# Patient Record
Sex: Female | Born: 1945 | Hispanic: No | Marital: Married | Smoking: Never smoker
Health system: Southern US, Community
[De-identification: ages and names within clinical notes are randomized; demographics above are authoritative.]

## PROBLEM LIST (undated history)

## (undated) DIAGNOSIS — E7800 Pure hypercholesterolemia, unspecified: Secondary | ICD-10-CM

## (undated) DIAGNOSIS — Z8669 Personal history of other diseases of the nervous system and sense organs: Secondary | ICD-10-CM

## (undated) HISTORY — PX: COLONOSCOPY: SHX174

## (undated) HISTORY — DX: Personal history of other diseases of the nervous system and sense organs: Z86.69

---

## 2011-04-05 DIAGNOSIS — J329 Chronic sinusitis, unspecified: Secondary | ICD-10-CM | POA: Insufficient documentation

## 2011-04-24 DIAGNOSIS — E7800 Pure hypercholesterolemia, unspecified: Secondary | ICD-10-CM | POA: Insufficient documentation

## 2014-08-31 ENCOUNTER — Ambulatory Visit: Payer: Self-pay | Admitting: Gastroenterology

## 2015-02-25 ENCOUNTER — Encounter: Payer: Self-pay | Admitting: Urgent Care

## 2015-02-25 ENCOUNTER — Emergency Department
Admission: EM | Admit: 2015-02-25 | Discharge: 2015-02-25 | Disposition: A | Discharge status: Home / Self Care | Payer: Medicare HMO | Attending: Emergency Medicine | Admitting: Emergency Medicine

## 2015-02-25 ENCOUNTER — Emergency Department: Payer: Medicare HMO

## 2015-02-25 DIAGNOSIS — Y998 Other external cause status: Secondary | ICD-10-CM | POA: Diagnosis not present

## 2015-02-25 DIAGNOSIS — Y9389 Activity, other specified: Secondary | ICD-10-CM | POA: Insufficient documentation

## 2015-02-25 DIAGNOSIS — T148 Other injury of unspecified body region: Secondary | ICD-10-CM | POA: Insufficient documentation

## 2015-02-25 DIAGNOSIS — M791 Myalgia, unspecified site: Secondary | ICD-10-CM

## 2015-02-25 DIAGNOSIS — Y92410 Unspecified street and highway as the place of occurrence of the external cause: Secondary | ICD-10-CM | POA: Insufficient documentation

## 2015-02-25 MED ORDER — TRAMADOL HCL 50 MG PO TABS: 50.0000 mg | ORAL_TABLET | Freq: Four times a day (QID) | ORAL | Status: DC | PRN

## 2015-02-25 MED ORDER — CYCLOBENZAPRINE HCL 10 MG PO TABS
10.0000 mg | ORAL_TABLET | Freq: Once | ORAL | Status: AC
  Administered 2015-02-25: 10 mg via ORAL
  Filled 2015-02-25: qty 1

## 2015-02-25 MED ORDER — OXYCODONE-ACETAMINOPHEN 5-325 MG PO TABS
1.0000 | ORAL_TABLET | Freq: Once | ORAL | Status: AC
  Administered 2015-02-25: 1 via ORAL
  Filled 2015-02-25: qty 1

## 2015-02-25 MED ORDER — IBUPROFEN 800 MG PO TABS: 800.0000 mg | ORAL_TABLET | Freq: Three times a day (TID) | ORAL | Status: DC | PRN

## 2015-02-25 MED ORDER — IBUPROFEN 800 MG PO TABS
800.0000 mg | ORAL_TABLET | Freq: Once | ORAL | Status: AC
  Administered 2015-02-25: 800 mg via ORAL
  Filled 2015-02-25: qty 1

## 2015-02-25 MED ORDER — CYCLOBENZAPRINE HCL 10 MG PO TABS: 10.0000 mg | ORAL_TABLET | Freq: Three times a day (TID) | ORAL | Status: DC | PRN
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
## 2015-02-25 NOTE — ED Notes (Signed)
Patient back from  X-ray 
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
## 2015-02-25 NOTE — ED Notes (Signed)
Patient transported to X-ray 
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
## 2015-12-27 DIAGNOSIS — M8589 Other specified disorders of bone density and structure, multiple sites: Secondary | ICD-10-CM | POA: Insufficient documentation

## 2015-12-27 DIAGNOSIS — R748 Abnormal levels of other serum enzymes: Secondary | ICD-10-CM | POA: Insufficient documentation

## 2015-12-27 DIAGNOSIS — M1712 Unilateral primary osteoarthritis, left knee: Secondary | ICD-10-CM | POA: Insufficient documentation

## 2016-02-28 ENCOUNTER — Ambulatory Visit: Payer: Self-pay

## 2016-02-28 ENCOUNTER — Encounter: Payer: Self-pay | Admitting: Podiatry

## 2016-02-28 ENCOUNTER — Ambulatory Visit (INDEPENDENT_AMBULATORY_CARE_PROVIDER_SITE_OTHER): Payer: Medicare HMO

## 2016-02-28 ENCOUNTER — Ambulatory Visit (INDEPENDENT_AMBULATORY_CARE_PROVIDER_SITE_OTHER): Payer: Medicare HMO | Admitting: Podiatry

## 2016-02-28 DIAGNOSIS — M79672 Pain in left foot: Secondary | ICD-10-CM

## 2016-02-28 DIAGNOSIS — R52 Pain, unspecified: Secondary | ICD-10-CM

## 2016-02-28 DIAGNOSIS — M722 Plantar fascial fibromatosis: Secondary | ICD-10-CM | POA: Diagnosis not present

## 2016-02-28 MED ORDER — METHYLPREDNISOLONE 4 MG PO TBPK: ORAL_TABLET | ORAL | 0 refills | Status: DC

## 2016-02-28 MED ORDER — MELOXICAM 15 MG PO TABS: 15.0000 mg | ORAL_TABLET | Freq: Every day | ORAL | 3 refills | Status: DC

## 2016-02-28 NOTE — Progress Notes (Signed)
   Subjective:    Patient ID: Valerie Berry, female    DOB: 10/28/1945, 70 y.o.   MRN: [id number]  HPI: She presents today with a chief complaint of painful heels and arches 2 years left greater than right. Since that morning pain is much worse than the left. Has tried new shoes which has helped to some degree.  Chief Complaint  Patient presents with  . Foot Pain    B/L HEEL/TOP OF THE FOOT HAVING SHARP PAIN FOR 3 MONTHS. FEET ARE WORSE WHEN WALKING. TRIED NO TREATMENT.     Review of Systems  Musculoskeletal: Positive for gait problem.       Objective:   Physical Exam: Vital signs are stable she is alert and oriented 3 and pleasant [place] female. Pulses are strongly palpable neurologic sensorium is intact. Deep tendon reflexes are intact. Muscle strength is 5 over 5 dorsiflexion plantar flexors and inverters everters all intrinsic musculature is intact. Orthopedic evaluation demonstrates all joints distal to the ankle for range of motion without crepitation. Cutaneous evaluation demonstrates supple well-hydrated cutis no erythema or edema cellulitis drainage or odor. Pain on palpation medial calcaneal tubercles bilateral. Radiographs demonstrate soft tissue increase in density at the plantar fascia calcaneal insertion sites bilateral. No other major osseous abnormalities are identified.          Assessment & Plan:  Assessment: Plantar fasciitis bilateral long-standing.  Plan: Injected the left and right heels today with Kenalog and local anesthetic. Placed her in plantar fascia brace and a night splint. Placed her on methylprednisolone to be followed by meloxicam. We discussed appropriate shoe gear shift exercises ice therapy issue modifications. We discussed the etiology pathology conservative versus surgical therapies. I'll follow up with her in 1 month.

## 2016-02-28 NOTE — Patient Instructions (Signed)

## 2016-03-14 ENCOUNTER — Telehealth: Payer: Self-pay | Admitting: Podiatry

## 2016-03-14 DIAGNOSIS — R7303 Prediabetes: Secondary | ICD-10-CM | POA: Insufficient documentation

## 2016-03-14 DIAGNOSIS — R079 Chest pain, unspecified: Secondary | ICD-10-CM | POA: Insufficient documentation

## 2016-03-14 MED ORDER — NONFORMULARY OR COMPOUNDED ITEM
6 refills | Status: DC
Start: 2016-03-14 — End: 2019-01-15

## 2016-03-14 NOTE — Addendum Note (Signed)
Addended by: Alphia Kava'CONNELL, VALERY D on: 03/14/2016 03:19 PM   Modules accepted: Orders

## 2016-03-14 NOTE — Telephone Encounter (Addendum)
Pt states the Meloxicam is making her nervous, has taken 11 days. Pt states she can't take the Ibuprofen because it bother her stomach. Dr. Al CorpusHyatt ordered [facility] Antiinflammatory cream.  Informed pt and she states understanding. [facility] to [facility].

## 2016-03-14 NOTE — Telephone Encounter (Signed)
Yes sure 

## 2016-03-14 NOTE — Telephone Encounter (Signed)
Patient called, said she cannot take the RX for pain that Dr. Al CorpusHyatt gave her its making her anxious and nervous. Wants nurse to call her about getting another script.

## 2016-04-03 ENCOUNTER — Encounter: Payer: Self-pay | Admitting: Podiatry

## 2016-04-03 ENCOUNTER — Ambulatory Visit (INDEPENDENT_AMBULATORY_CARE_PROVIDER_SITE_OTHER): Payer: Medicare HMO | Admitting: Podiatry

## 2016-04-03 VITALS — BP 128/73 | HR 57 | Ht 63.0 in | Wt 134.0 lb

## 2016-04-03 DIAGNOSIS — M722 Plantar fascial fibromatosis: Secondary | ICD-10-CM | POA: Diagnosis not present

## 2016-04-03 NOTE — Progress Notes (Signed)
She presents today for follow-up of her plantar fasciitis. She states that she is doing some better but is still sore.  Objective: Vital signs are stable she's alert and oriented 3. Pulses are palpable. Pain on palpation medial calcaneal tubercles.  Assessment: Fasciitis.  Plan: We injected bilateral heels follow up with her in 1 month.

## 2016-04-05 ENCOUNTER — Emergency Department: Payer: Medicare HMO

## 2016-04-05 ENCOUNTER — Emergency Department
Admission: EM | Admit: 2016-04-05 | Discharge: 2016-04-05 | Disposition: A | Discharge status: Home / Self Care | Payer: Medicare HMO | Attending: Emergency Medicine | Admitting: Emergency Medicine

## 2016-04-05 ENCOUNTER — Encounter: Payer: Self-pay | Admitting: Emergency Medicine

## 2016-04-05 DIAGNOSIS — Z79899 Other long term (current) drug therapy: Secondary | ICD-10-CM | POA: Insufficient documentation

## 2016-04-05 DIAGNOSIS — Z791 Long term (current) use of non-steroidal anti-inflammatories (NSAID): Secondary | ICD-10-CM | POA: Diagnosis not present

## 2016-04-05 DIAGNOSIS — R1011 Right upper quadrant pain: Secondary | ICD-10-CM | POA: Insufficient documentation

## 2016-04-05 DIAGNOSIS — R109 Unspecified abdominal pain: Secondary | ICD-10-CM | POA: Diagnosis present

## 2016-04-05 HISTORY — DX: Pure hypercholesterolemia, unspecified: E78.00

## 2016-04-05 LAB — COMPREHENSIVE METABOLIC PANEL
ALBUMIN: 4.7 g/dL (ref 3.5–5.0)
ALK PHOS: 63 U/L (ref 38–126)
ALT: 34 U/L (ref 14–54)
AST: 29 U/L (ref 15–41)
Anion gap: 9 (ref 5–15)
BUN: 22 mg/dL — AB (ref 6–20)
CALCIUM: 9.4 mg/dL (ref 8.9–10.3)
CO2: 23 mmol/L (ref 22–32)
Chloride: 106 mmol/L (ref 101–111)
Creatinine, Ser: 0.62 mg/dL (ref 0.44–1.00)
GFR calc Af Amer: >60 mL/min (ref >60)
GFR calc non Af Amer: >60 mL/min (ref >60)
GLUCOSE: 101 mg/dL — AB (ref 65–99)
Potassium: 3.5 mmol/L (ref 3.5–5.1)
Sodium: 138 mmol/L (ref 135–145)
TOTAL PROTEIN: 7.8 g/dL (ref 6.5–8.1)
Total Bilirubin: 0.7 mg/dL (ref 0.3–1.2)

## 2016-04-05 LAB — URINALYSIS COMPLETE WITH MICROSCOPIC (ARMC ONLY)
BILIRUBIN URINE: NEGATIVE
Bacteria, UA: NONE SEEN
GLUCOSE, UA: NEGATIVE mg/dL
Ketones, ur: NEGATIVE mg/dL
Leukocytes, UA: NEGATIVE
NITRITE: NEGATIVE
Protein, ur: NEGATIVE mg/dL
SPECIFIC GRAVITY, URINE: 1.005 (ref 1.005–1.030)
pH: 6 (ref 5.0–8.0)

## 2016-04-05 LAB — CBC
HCT: 41.4 % (ref 35.0–47.0)
Hemoglobin: 14.2 g/dL (ref 12.0–16.0)
MCH: 30.3 pg (ref 26.0–34.0)
MCHC: 34.2 g/dL (ref 32.0–36.0)
MCV: 88.5 fL (ref 80.0–100.0)
Platelets: 233 10*3/uL (ref 150–440)
RBC: 4.68 MIL/uL (ref 3.80–5.20)
RDW: 13.1 % (ref 11.5–14.5)
WBC: 7.8 10*3/uL (ref 3.6–11.0)

## 2016-04-05 LAB — LIPASE, BLOOD: Lipase: 29 U/L (ref 11–51)

## 2016-04-05 MED ORDER — CARISOPRODOL 350 MG PO TABS: 350.0000 mg | ORAL_TABLET | Freq: Three times a day (TID) | ORAL | 0 refills | Status: AC | PRN

## 2016-04-05 MED ORDER — IOPAMIDOL (ISOVUE-300) INJECTION 61%
100.0000 mL | Freq: Once | INTRAVENOUS | Status: AC | PRN
  Administered 2016-04-05: 100 mL via INTRAVENOUS
  Filled 2016-04-05: qty 100

## 2016-04-05 MED ORDER — IOPAMIDOL (ISOVUE-300) INJECTION 61%
30.0000 mL | Freq: Once | INTRAVENOUS | Status: AC
  Administered 2016-04-05: 30 mL via ORAL
  Filled 2016-04-05: qty 30

## 2016-04-05 MED ORDER — SODIUM CHLORIDE 0.9 % IV BOLUS (SEPSIS)
1000.0000 mL | Freq: Once | INTRAVENOUS | Status: AC
  Administered 2016-04-05: 1000 mL via INTRAVENOUS

## 2016-04-05 NOTE — ED Notes (Signed)
Pt states she is done with both bottles of contrast. CT notified.

## 2016-04-05 NOTE — ED Notes (Signed)
Pt returned from CT via stretcher.

## 2016-04-05 NOTE — ED Provider Notes (Signed)
[facility] Emergency Department Provider Note   ____________________________________________   First MD Initiated Contact with Patient 04/05/16 1710     (approximate)  I have reviewed the triage vital signs and the nursing notes.   HISTORY  Chief Complaint Abdominal Pain   HPI Valerie Berry is a 70 y.o. female with a history of high cholesterol who is presenting with 3 weeks of worsening right-sided abdominal pain as well as right-sided flank pain. Says the pain is constant and slowly worsening. She denies any nausea vomiting or diarrhea. Denies any injury. Denies any worsening with eating or bending or twisting.   Past Medical History:  Diagnosis Date  . High cholesterol     Patient Active Problem List   Diagnosis Date Noted  . Plantar fasciitis, bilateral 02/28/2016    Past Surgical History:  Procedure Laterality Date  . CESAREAN SECTION      Prior to Admission medications   Medication Sig Start Date End Date Taking? Authorizing Provider  atorvastatin (LIPITOR) 20 MG tablet Take by mouth. 01/06/16 07/04/16  Historical Provider, MD  cyclobenzaprine (FLEXERIL) 10 MG tablet Take 1 tablet (10 mg total) by mouth every 8 (eight) hours as needed for muscle spasms. 02/25/15   Joni Reining, PA-C  ibuprofen (ADVIL,MOTRIN) 800 MG tablet Take 1 tablet (800 mg total) by mouth every 8 (eight) hours as needed for moderate pain. 02/25/15   Joni Reining, PA-C  meloxicam (MOBIC) 15 MG tablet Take 1 tablet (15 mg total) by mouth daily. 02/28/16   Max T Hyatt, DPM  methylPREDNISolone (MEDROL) 4 MG TBPK tablet Tapering 6 day dose pack 02/28/16   Max T [place], DPM  NONFORMULARY OR COMPOUNDED ITEM [facility]:  Antiinflammatory cream - Diclofenac 3%, Baclofen 2%, Cyclobenzaprine 2%, Lidocaine 2%, apply 1-2 grams to affected area 3-4 times daily. 03/14/16   Max T Hyatt, DPM  traMADol (ULTRAM) 50 MG tablet Take 1 tablet (50 mg total) by mouth every 6 (six) hours as  needed for moderate pain. 02/25/15   Joni Reining, PA-C    Allergies Review of patient's allergies indicates no known allergies.  No family history on file.  Social History Social History  Substance Use Topics  . Smoking status: Never Smoker  . Smokeless tobacco: Never Used  . Alcohol use No    Review of Systems Constitutional: No fever/chills Eyes: No visual changes. ENT: No sore throat. Cardiovascular: Denies chest pain. Respiratory: Denies shortness of breath. Gastrointestinal:No nausea, no vomiting.  No diarrhea.  No constipation. Genitourinary: Negative for dysuria. Musculoskeletal: Negative for back pain. Skin: Negative for rash. Neurological: Negative for headaches, focal weakness or numbness.  10-point ROS otherwise negative.  ____________________________________________   PHYSICAL EXAM:  VITAL SIGNS: ED Triage Vitals  Enc Vitals Group     BP 04/05/16 1534 (!) 153/83     Pulse Rate 04/05/16 1534 (!) 58     Resp 04/05/16 1534 16     Temp 04/05/16 1534 97.9 F (36.6 C)     Temp Source 04/05/16 1534 Oral     SpO2 04/05/16 1534 98 %     Weight 04/05/16 1533 138 lb (62.6 kg)     Height 04/05/16 1533 5\' 3"  (1.6 m)     Head Circumference --      Peak Flow --      Pain Score 04/05/16 1533 10     Pain Loc --      Pain Edu? --      Excl.  in GC? --     Constitutional: Alert and oriented. Well appearing and in no acute distress. Eyes: Conjunctivae are normal. PERRL. EOMI. Head: Atraumatic. Nose: No congestion/rhinnorhea. Mouth/Throat: Mucous membranes are moist.   Neck: No stridor.   Cardiovascular: Normal rate, regular rhythm. Grossly normal heart sounds.   Respiratory: Normal respiratory effort.  No retractions. Lungs CTAB. Gastrointestinal: Soft with right-sided abdominal pain with a negative Murphy sign. No distention. No abdominal bruits. Positive for high right-sided CVA tenderness. Musculoskeletal: No lower extremity tenderness nor edema.  No joint  effusions. Neurologic:  Normal speech and language. No gross focal neurologic deficits are appreciated. No gait instability. Skin:  Skin is warm, dry and intact. No rash noted. Psychiatric: Mood and affect are normal. Speech and behavior are normal.  ____________________________________________   LABS (all labs ordered are listed, but only abnormal results are displayed)  Labs Reviewed  COMPREHENSIVE METABOLIC PANEL - Abnormal; Notable for the following:       Result Value   Glucose, Bld 101 (*)    BUN 22 (*)    All other components within normal limits  URINALYSIS COMPLETEWITH MICROSCOPIC ([facility] ONLY) - Abnormal; Notable for the following:    Color, Urine COLORLESS (*)    APPearance CLEAR (*)    Hgb urine dipstick 1+ (*)    Squamous Epithelial / LPF 0-5 (*)    All other components within normal limits  LIPASE, BLOOD  CBC   ____________________________________________  EKG  ED ECG REPORT I, Arelia LongestSchaevitz,  Janelie Goltz M, the attending physician, personally viewed and interpreted this ECG.   Date: 04/05/2016  EKG Time: 1540  Rate: 64  Rhythm: normal sinus rhythm with sinus arrhythmia  Axis: Normal  Intervals:none  ST&T Change: No ST segment elevation or depression. No abnormal T-wave inversion.  ____________________________________________  RADIOLOGY  CT Abdomen Pelvis W Contrast (Accession [id number]) (Order [id number])  Imaging  Date: 04/05/2016 Department: [facility] EMERGENCY DEPARTMENT Released By/Authorizing: Myrna Blazeravid Matthew Alora Gorey, MD (auto-released)  PACS Images   Show images for CT Abdomen Pelvis W Contrast  Study Result   CLINICAL DATA:  Right-sided abdominal pain. Abdominal pressure for 1 week, worse today. Nausea.  EXAM: CT ABDOMEN AND PELVIS WITH CONTRAST  TECHNIQUE: Multidetector CT imaging of the abdomen and pelvis was performed using the standard protocol following bolus administration of intravenous contrast.  CONTRAST:   100mL ISOVUE-300 IOPAMIDOL (ISOVUE-300) INJECTION 61% IV  COMPARISON:  None.  FINDINGS: Lower chest: Motion obscured. Tiny subpleural density in the left lower lobe is likely atelectasis.  Hepatobiliary: No focal liver abnormality is seen. No gallstones, gallbladder wall thickening, or biliary dilatation. Mild motion through the hepatic dome.  Pancreas: No ductal dilatation or inflammation.  Spleen: Normal in size without focal abnormality.  Adrenals/Urinary Tract: Adrenal glands are unremarkable. Kidneys are normal, without renal calculi, focal lesion, or hydronephrosis. Symmetric renal excretion. Bladder is unremarkable.  Stomach/Bowel: Stomach is within normal limits. No evidence of bowel wall thickening, distention, or inflammatory changes. Moderate diffuse stool burden with tortuosity of the sigmoid colon. No bowel obstruction. No abnormal rectal distention. Appendix appears normal.  Vascular/Lymphatic: No significant vascular findings are present. No enlarged abdominal or pelvic lymph nodes.  Reproductive: Uterus and bilateral adnexa are unremarkable.  Other: No free air, free fluid, or intra-abdominal fluid collection.  Musculoskeletal: No acute or significant osseous findings. 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  IMPRESSION: 1. No acute abnormality. 2. Moderate stool burden with colonic tortuosity, can be seen with constipation.  Electronically Signed   By: Rubye Oaks M.D.   On: 04/05/2016 21:04    ____________________________________________   PROCEDURES  Procedure(s) performed:   Procedures  Critical Care performed:   ____________________________________________   INITIAL IMPRESSION / ASSESSMENT AND PLAN / ED COURSE  Pertinent labs & imaging results that were available during my care of the patient were reviewed by me and considered in my medical decision making (see chart for  details).  ----------------------------------------- 9:54 PM on 04/05/2016 -----------------------------------------  Patient very reassuring workup.  This comfortable appearing in the room. Reexamined her abdomen and is very minimally tender to the right upper quadrant. I discussed the lab workup as well as the imaging study with possible constipation. However, she says that she moves her bowels regularly every morning including this morning. 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 She says that she has been using icy hot to the area which has been helping minimally. We will try a muscle relaxer in addition. She denies the pain worsening with deep breathing. No chest pain or shortness of breath. Normal EKG  Unlikely to be cardiac pathology or PE. Possible musculoskeletal etiology. We'll discharge with muscle relaxer.   Clinical Course     ____________________________________________   FINAL CLINICAL IMPRESSION(S) / ED DIAGNOSES  Right-sided abdominal pain. Right flank pain.    NEW MEDICATIONS STARTED DURING THIS VISIT:  New Prescriptions   No medications on file     Note:  This document was prepared using Dragon voice recognition software and may include unintentional dictation errors.    Myrna Blazer, MD 04/05/16 2204

## 2016-04-05 NOTE — ED Triage Notes (Signed)
Pt reports abdominal pressure x1 week, reports pain has worsened today. Pt reports nausea, denies vomiting or diarrhea.

## 2016-05-08 ENCOUNTER — Ambulatory Visit: Payer: Medicare HMO | Admitting: Podiatry

## 2017-02-05 DIAGNOSIS — M25562 Pain in left knee: Secondary | ICD-10-CM | POA: Insufficient documentation

## 2017-02-05 DIAGNOSIS — G8929 Other chronic pain: Secondary | ICD-10-CM | POA: Insufficient documentation

## 2017-02-05 DIAGNOSIS — K219 Gastro-esophageal reflux disease without esophagitis: Secondary | ICD-10-CM | POA: Insufficient documentation

## 2017-09-21 IMAGING — CT CT ABD-PELV W/ CM
2 of 5 series · 16 of 46 positions shown, 18 images · IV contrast (APPLIED)
Comparison: None.

CLINICAL DATA: Right-sided abdominal pain. Abdominal pressure for 1
week, worse today. Nausea.

EXAM:
CT ABDOMEN AND PELVIS WITH CONTRAST
TECHNIQUE: Multidetector CT imaging of the abdomen and pelvis was performed
using the standard protocol following bolus administration of
intravenous contrast.
CONTRAST:  100mL HZP2JG-866 IOPAMIDOL (HZP2JG-866) INJECTION 61% IV

[Series 2: axial st · axial · 0.67mm/px · z∈[-779,-379]mm · 13 of 90 slices shown, 15 images]
[im 5/90  soft-tissue]
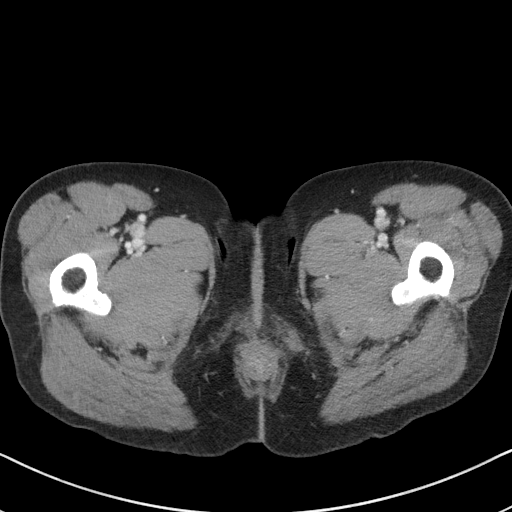
[im 5/90  bone]
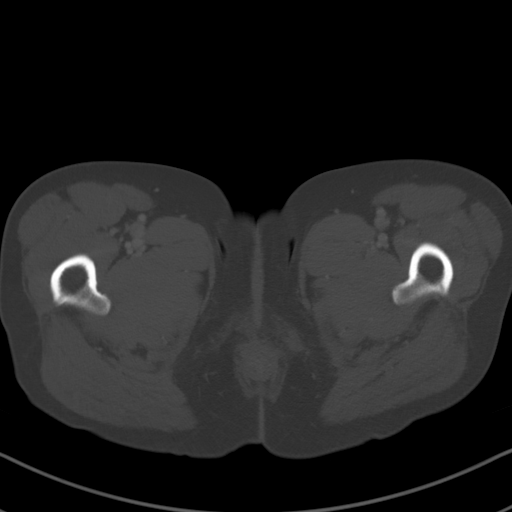
[im 14/90  soft-tissue]
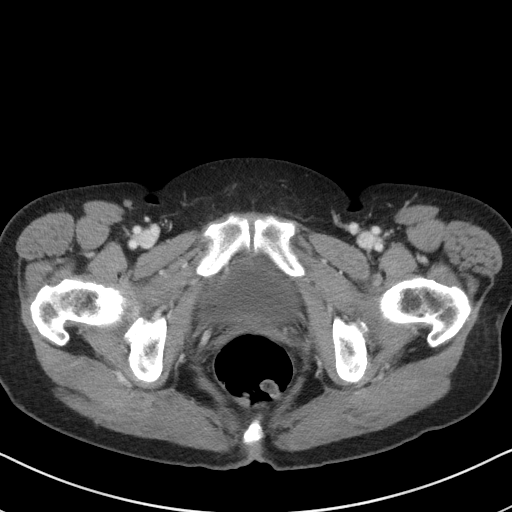
[im 18/90  soft-tissue]
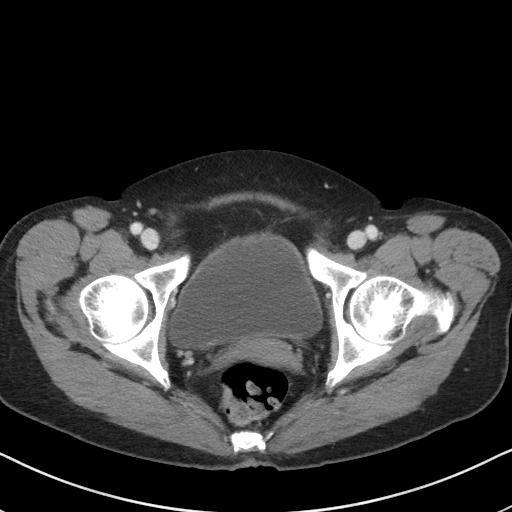
[im 27/90  soft-tissue]
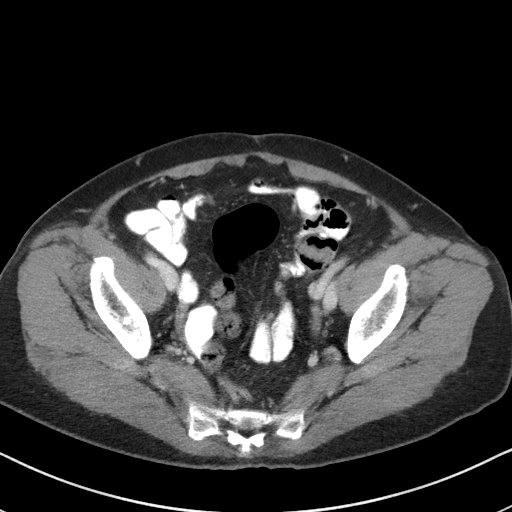
[im 32/90  soft-tissue]
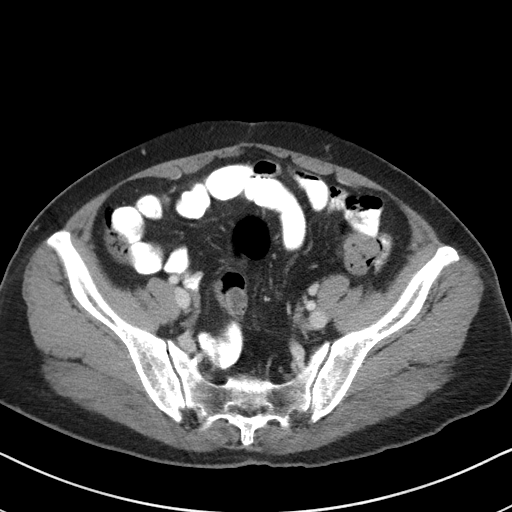
[im 41/90  soft-tissue]
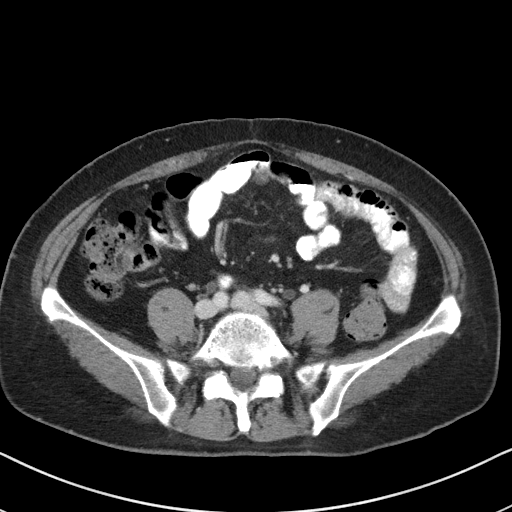
[im 45/90  soft-tissue]
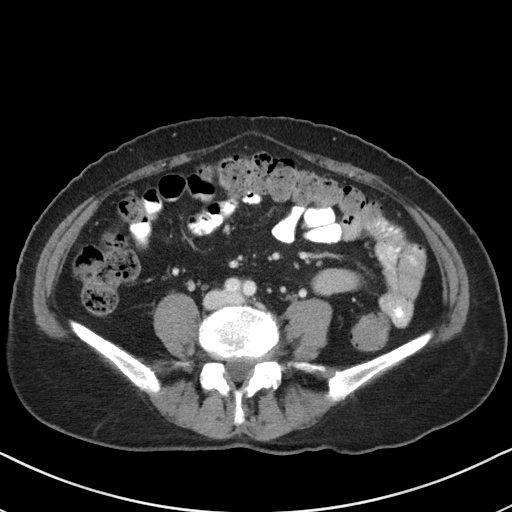
[im 49/90  soft-tissue]
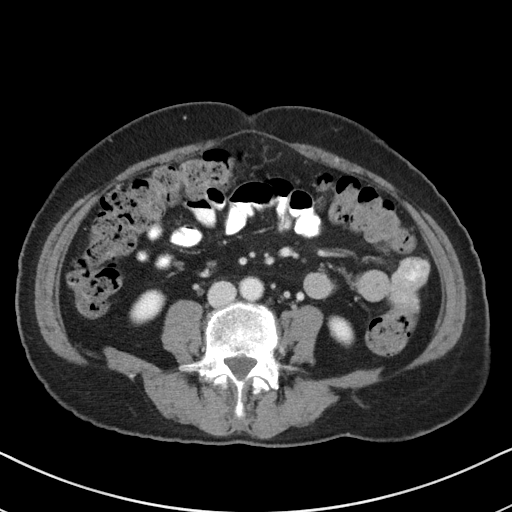
[im 58/90  soft-tissue]
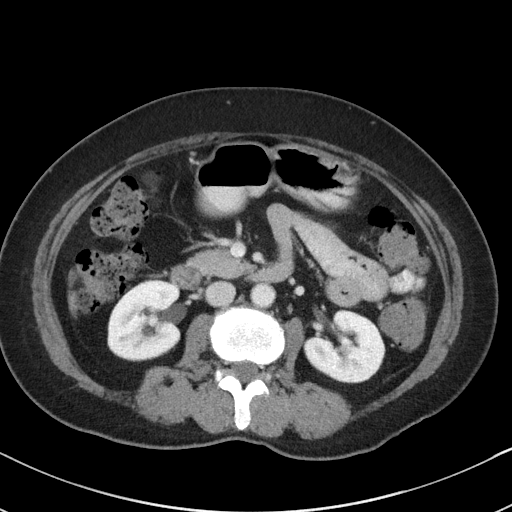
[im 58/90  bone]
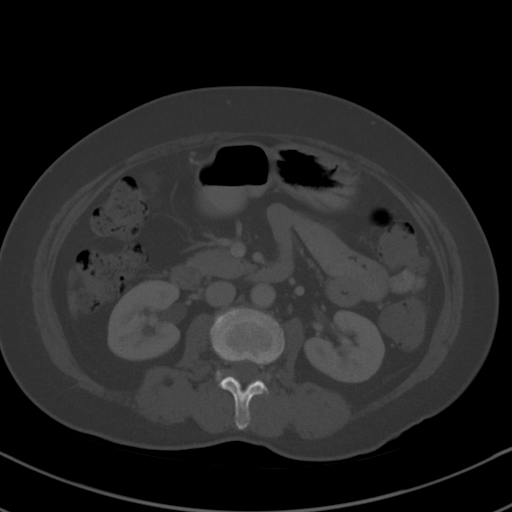
[im 63/90  soft-tissue]
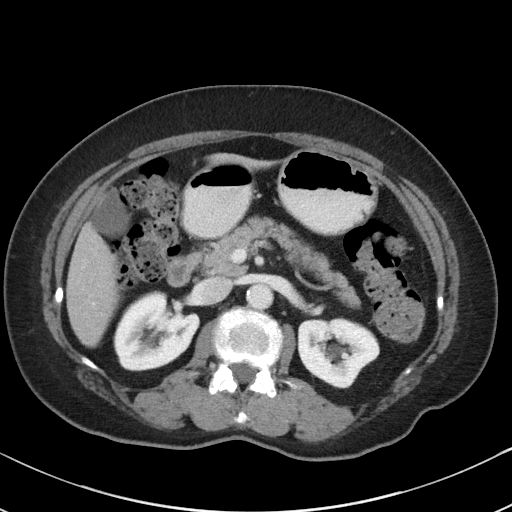
[im 72/90  soft-tissue]
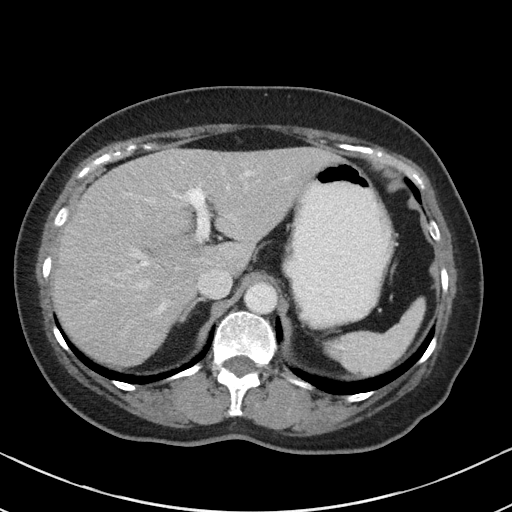
[im 76/90  soft-tissue]
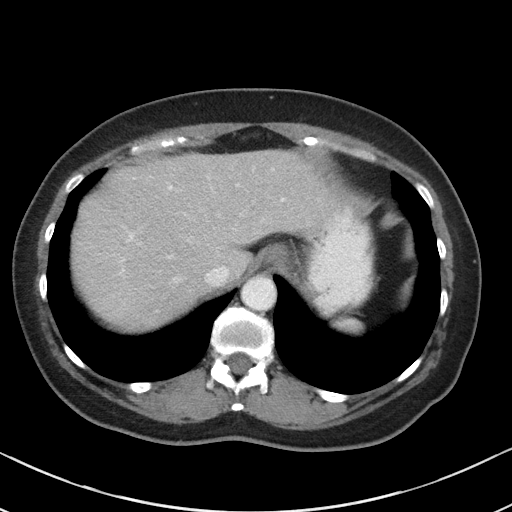
[im 85/90  soft-tissue]
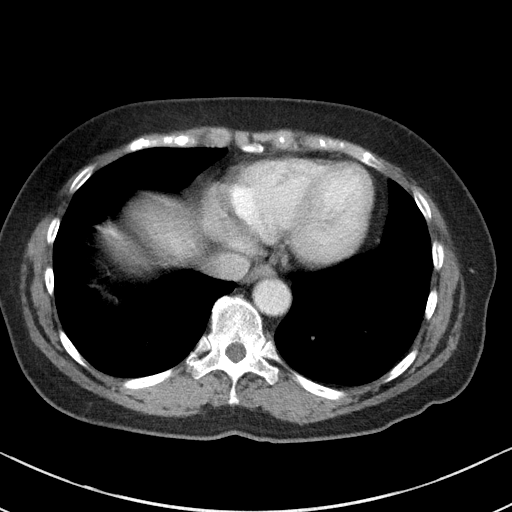

[Series 6: coronal st · coronal · 0.68mm/px · 3 of 87 slices shown]
[im 29/87  soft-tissue]
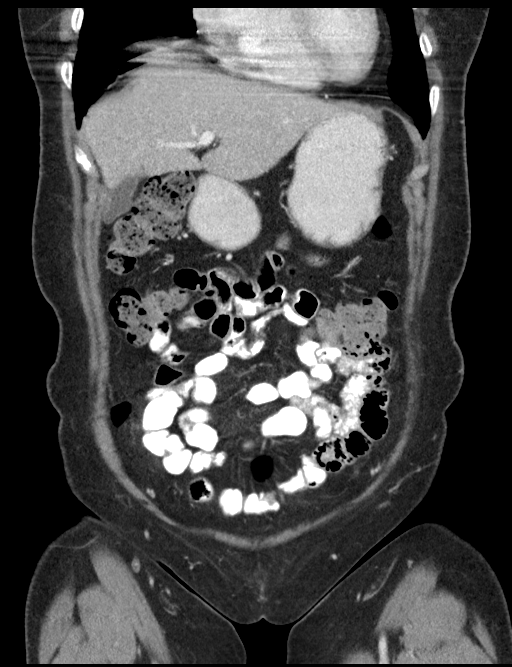
[im 39/87  soft-tissue]
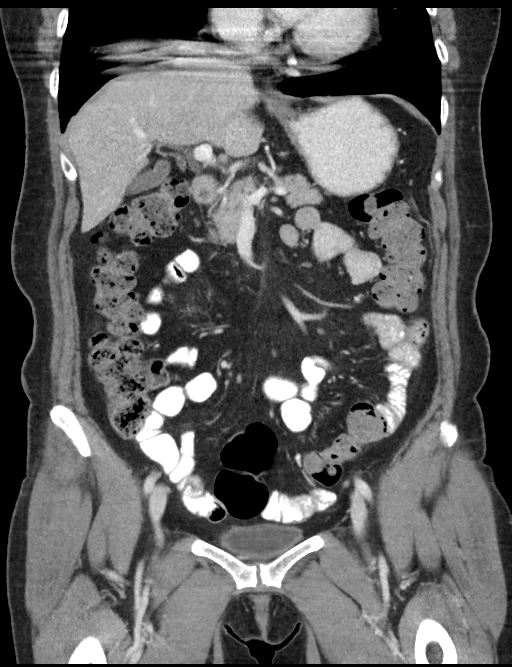
[im 48/87  soft-tissue]
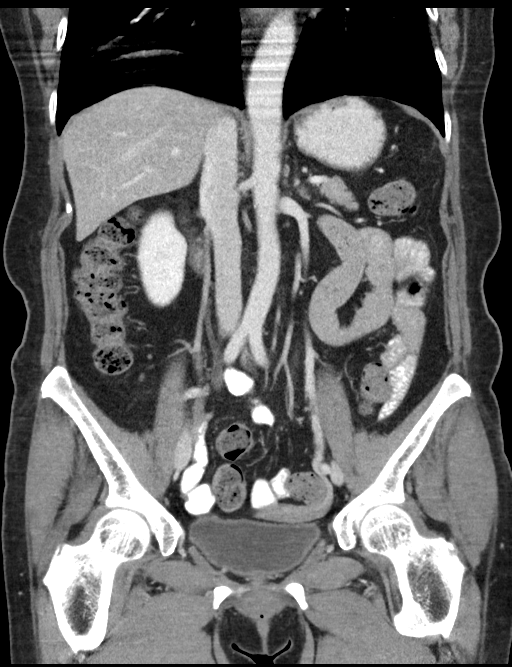

[16 of 46 positions shown; findings below may reference images not displayed]

FINDINGS: Lower chest: Motion obscured. Tiny subpleural density in the left
lower lobe is likely atelectasis.

Hepatobiliary: No focal liver abnormality is seen. No gallstones,
gallbladder wall thickening, or biliary dilatation. Mild motion
through the hepatic dome.

Pancreas: No ductal dilatation or inflammation.

Spleen: Normal in size without focal abnormality.

Adrenals/Urinary Tract: Adrenal glands are unremarkable. Kidneys are
normal, without renal calculi, focal lesion, or hydronephrosis.
Symmetric renal excretion. Bladder is unremarkable.

Stomach/Bowel: Stomach is within normal limits. No evidence of bowel
wall thickening, distention, or inflammatory changes. Moderate
diffuse stool burden with tortuosity of the sigmoid colon. No bowel
obstruction. No abnormal rectal distention. Appendix appears normal.

Vascular/Lymphatic: No significant vascular findings are present. No
enlarged abdominal or pelvic lymph nodes.

Reproductive: Uterus and bilateral adnexa are unremarkable.

Other: No free air, free fluid, or intra-abdominal fluid collection.

Musculoskeletal: No acute or significant osseous findings.
Degenerative disc disease at L4-L5 and L5-S1.
IMPRESSION: 1. No acute abnormality.
2. Moderate stool burden with colonic tortuosity, can be seen with
constipation.

## 2017-12-24 DIAGNOSIS — M19011 Primary osteoarthritis, right shoulder: Secondary | ICD-10-CM | POA: Insufficient documentation

## 2018-04-19 ENCOUNTER — Other Ambulatory Visit: Payer: Self-pay | Admitting: Internal Medicine

## 2018-04-19 DIAGNOSIS — Z1231 Encounter for screening mammogram for malignant neoplasm of breast: Secondary | ICD-10-CM

## 2018-04-19 DIAGNOSIS — R3129 Other microscopic hematuria: Secondary | ICD-10-CM | POA: Insufficient documentation

## 2018-04-19 DIAGNOSIS — R7401 Elevation of levels of liver transaminase levels: Secondary | ICD-10-CM | POA: Insufficient documentation

## 2018-05-13 ENCOUNTER — Ambulatory Visit
Admission: RE | Admit: 2018-05-13 | Discharge: 2018-05-13 | Disposition: A | Discharge status: Home / Self Care | Payer: Medicare HMO | Source: Ambulatory Visit | Attending: Internal Medicine | Admitting: Internal Medicine

## 2018-05-13 DIAGNOSIS — Z1231 Encounter for screening mammogram for malignant neoplasm of breast: Secondary | ICD-10-CM | POA: Diagnosis present

## 2019-01-15 ENCOUNTER — Other Ambulatory Visit: Payer: Self-pay

## 2019-01-15 ENCOUNTER — Encounter: Payer: Self-pay | Admitting: Podiatry

## 2019-01-15 ENCOUNTER — Ambulatory Visit (INDEPENDENT_AMBULATORY_CARE_PROVIDER_SITE_OTHER): Payer: Medicare HMO

## 2019-01-15 ENCOUNTER — Ambulatory Visit (INDEPENDENT_AMBULATORY_CARE_PROVIDER_SITE_OTHER): Payer: Medicare HMO | Admitting: Podiatry

## 2019-01-15 VITALS — Temp 97.6°F

## 2019-01-15 DIAGNOSIS — M722 Plantar fascial fibromatosis: Secondary | ICD-10-CM

## 2019-01-15 MED ORDER — DICLOFENAC SODIUM 75 MG PO TBEC: 75.0000 mg | DELAYED_RELEASE_TABLET | Freq: Two times a day (BID) | ORAL | 1 refills | Status: DC

## 2019-01-15 MED ORDER — METHYLPREDNISOLONE 4 MG PO TBPK: ORAL_TABLET | ORAL | 0 refills | Status: DC

## 2019-01-15 NOTE — Progress Notes (Signed)
She presents today with chief complaint of pain to the dorsal lateral aspect of the foot around the midfoot and anterior ankle.  States that she had bilateral heel pain plantarly.  Objective: Vital signs are stable alert and oriented x3.  Pulses are palpable.  Neurologic sensorium is intact degenerative flexors are intact muscle strength is normal symmetrical.  She has pain on palpation medial calcaneal tubercle left foot.  Right foot is tender as well.  Radiographs demonstrate soft tissue increase in density plantar calcaneal insertion site of the right foot.  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  Plan: I injected the plantar fascia today of the right heel with 20 mg Kenalog 5 mg Marcaine start her on Medrol Dosepak and diclofenac 75 mg 1 p.o. twice daily and a a plantar fascial brace.

## 2019-01-15 NOTE — Patient Instructions (Signed)

## 2019-01-20 DIAGNOSIS — R7303 Prediabetes: Secondary | ICD-10-CM | POA: Diagnosis not present

## 2019-01-20 DIAGNOSIS — E7800 Pure hypercholesterolemia, unspecified: Secondary | ICD-10-CM | POA: Diagnosis not present

## 2019-01-27 DIAGNOSIS — L237 Allergic contact dermatitis due to plants, except food: Secondary | ICD-10-CM | POA: Diagnosis not present

## 2019-01-27 DIAGNOSIS — R7303 Prediabetes: Secondary | ICD-10-CM | POA: Diagnosis not present

## 2019-01-27 DIAGNOSIS — E7800 Pure hypercholesterolemia, unspecified: Secondary | ICD-10-CM | POA: Diagnosis not present

## 2019-01-27 DIAGNOSIS — Z23 Encounter for immunization: Secondary | ICD-10-CM | POA: Diagnosis not present

## 2019-02-24 ENCOUNTER — Other Ambulatory Visit: Payer: Self-pay

## 2019-02-24 ENCOUNTER — Ambulatory Visit: Payer: Medicare HMO | Admitting: Podiatry

## 2019-02-24 ENCOUNTER — Encounter: Payer: Self-pay | Admitting: Podiatry

## 2019-02-24 VITALS — Temp 97.4°F

## 2019-02-24 DIAGNOSIS — M722 Plantar fascial fibromatosis: Secondary | ICD-10-CM | POA: Diagnosis not present

## 2019-02-24 DIAGNOSIS — R252 Cramp and spasm: Secondary | ICD-10-CM

## 2019-02-24 NOTE — Progress Notes (Signed)
She presents today for follow-up of her plantar fasciitis she states is doing a whole lot better but now starting get a little fasciculation or cramping across the medial foot and up into my leg.  Objective: Vital signs are stable she is alert and oriented x3.  Pulses are palpable.  She has pain on palpation medial calcaneal tubercle of the bilateral l heels.  Assessment: Plantar fasciitis bilateral.  Resolving.  Cramping of the feet and legs  Plan: Discussed etiology pathology conservative versus surgical therapies.  At this point performed injections 20 mg Kenalog 5 mg Marcaine after Betadine skin prep.  Tolerated procedure well without complications.  Recommended there works and discussed the use of anti-inflammatories.

## 2019-04-18 ENCOUNTER — Other Ambulatory Visit: Payer: Self-pay | Admitting: Family Medicine

## 2019-04-18 DIAGNOSIS — Z1231 Encounter for screening mammogram for malignant neoplasm of breast: Secondary | ICD-10-CM

## 2019-04-21 ENCOUNTER — Ambulatory Visit: Payer: Medicare HMO | Admitting: Podiatry

## 2019-04-23 ENCOUNTER — Other Ambulatory Visit: Payer: Self-pay

## 2019-04-23 ENCOUNTER — Ambulatory Visit: Payer: Medicare HMO | Admitting: Podiatry

## 2019-04-23 ENCOUNTER — Encounter: Payer: Self-pay | Admitting: Podiatry

## 2019-04-23 DIAGNOSIS — M722 Plantar fascial fibromatosis: Secondary | ICD-10-CM

## 2019-04-23 NOTE — Progress Notes (Signed)
She presents today for follow-up of plantar fasciitis states that they are doing a whole lot better I think I need 1 more injection.  Objective: Vital signs are stable alert and oriented x3.  Pulses are palpable.  She has pain on palpation medial calcaneal tubercle of the bilateral heels.  Assessment: Pain in limb secondary to plantar fasciitis bilateral slowly resolving.  Plan: Reinjected bilateral heels today 20 mg Kenalog 5 mg Marcaine point of maximal tenderness bilaterally.  Tolerated procedure well follow-up with her on an as-needed basis.

## 2019-05-08 DIAGNOSIS — J0100 Acute maxillary sinusitis, unspecified: Secondary | ICD-10-CM | POA: Diagnosis not present

## 2019-05-08 DIAGNOSIS — S29011A Strain of muscle and tendon of front wall of thorax, initial encounter: Secondary | ICD-10-CM | POA: Diagnosis not present

## 2019-05-22 ENCOUNTER — Ambulatory Visit
Admission: RE | Admit: 2019-05-22 | Discharge: 2019-05-22 | Disposition: A | Discharge status: Home / Self Care | Payer: Medicare HMO | Source: Ambulatory Visit | Attending: Family Medicine | Admitting: Family Medicine

## 2019-05-22 DIAGNOSIS — Z1231 Encounter for screening mammogram for malignant neoplasm of breast: Secondary | ICD-10-CM | POA: Diagnosis not present

## 2019-07-22 DIAGNOSIS — R7303 Prediabetes: Secondary | ICD-10-CM | POA: Diagnosis not present

## 2019-07-22 DIAGNOSIS — E7800 Pure hypercholesterolemia, unspecified: Secondary | ICD-10-CM | POA: Diagnosis not present

## 2019-07-29 DIAGNOSIS — Z0000 Encounter for general adult medical examination without abnormal findings: Secondary | ICD-10-CM | POA: Diagnosis not present

## 2019-07-29 DIAGNOSIS — E7800 Pure hypercholesterolemia, unspecified: Secondary | ICD-10-CM | POA: Diagnosis not present

## 2019-07-29 DIAGNOSIS — R7303 Prediabetes: Secondary | ICD-10-CM | POA: Diagnosis not present

## 2019-09-30 DIAGNOSIS — J019 Acute sinusitis, unspecified: Secondary | ICD-10-CM | POA: Diagnosis not present

## 2019-09-30 DIAGNOSIS — Z03818 Encounter for observation for suspected exposure to other biological agents ruled out: Secondary | ICD-10-CM | POA: Diagnosis not present

## 2019-12-24 ENCOUNTER — Ambulatory Visit (INDEPENDENT_AMBULATORY_CARE_PROVIDER_SITE_OTHER): Payer: Medicare HMO

## 2019-12-24 ENCOUNTER — Encounter: Payer: Self-pay | Admitting: Podiatry

## 2019-12-24 ENCOUNTER — Other Ambulatory Visit: Payer: Self-pay

## 2019-12-24 ENCOUNTER — Other Ambulatory Visit: Payer: Self-pay | Admitting: Podiatry

## 2019-12-24 ENCOUNTER — Ambulatory Visit: Payer: Medicare HMO | Admitting: Podiatry

## 2019-12-24 DIAGNOSIS — M722 Plantar fascial fibromatosis: Secondary | ICD-10-CM

## 2019-12-24 DIAGNOSIS — M778 Other enthesopathies, not elsewhere classified: Secondary | ICD-10-CM

## 2019-12-24 MED ORDER — METHYLPREDNISOLONE 4 MG PO TBPK: ORAL_TABLET | ORAL | 0 refills | Status: DC

## 2019-12-24 NOTE — Addendum Note (Signed)
Addended by: Lottie Rater E on: 12/24/2019 02:06 PM   Modules accepted: Orders

## 2019-12-24 NOTE — Progress Notes (Signed)
She presents today chief complaint of pain and swelling after rolling her ankles bilaterally.  Says been bothering her for about a month or so now states that the heels that really been bothering her plantar aspect posterior aspect she says is like stepping on a bruise.  Left is worse than the right 1 she is tried over-the-counter Tiger balm and pain patches no relief.  Objective: Vital signs are stable she is alert and oriented x3.  Pulses are palpable.  She has some mild edema with some postinflammatory hyperpigmentation along the medial aspect of the left ankle.  Moderately tender on palpation.  Posterior tibial tendon is normal and pain-free.  She has moderate to severe pain on palpation medial calcaneal tubercle of the left heel.  Radiographs taken today do not demonstrate any type of fracture though today do demonstrate soft tissue increase in density at the plantar fashion calcaneal insertion site.  The right foot demonstrates pain on palpation medial calcaneal tubercle no inflammation however.  Assessment: Plantar fasciitis left with some plantar fasciitis to the right foot as well.  Plan: Start her on a Medrol Dosepak she saw Raiford Noble today for orthotics and I injected bilateral heels 20 mg of Kenalog 5 mg of Marcaine.  Follow-up with her in 1 month.  She was given both oral and written go instructions for the care of her plantar fasciitis.

## 2019-12-25 ENCOUNTER — Telehealth: Payer: Self-pay | Admitting: Podiatry

## 2019-12-25 NOTE — Telephone Encounter (Signed)
Pt called and canceled the order for orthotics. She said her son got her something to try it first.

## 2020-01-02 ENCOUNTER — Telehealth: Payer: Self-pay | Admitting: *Deleted

## 2020-01-02 MED ORDER — HYDROXYZINE HCL 25 MG PO TABS: 25.0000 mg | ORAL_TABLET | Freq: Three times a day (TID) | ORAL | 0 refills | Status: DC

## 2020-01-02 NOTE — Addendum Note (Signed)
Addended by: Geraldine Contras D on: 01/02/2020 11:34 AM   Modules accepted: Orders

## 2020-01-02 NOTE — Telephone Encounter (Signed)
I called patient, she stated that after taking the first dose of prednisone, she started flushing really bad, the second day, her leg started getting rash on them.  She says she finished all the prednisone but now still has rash mainly on her legs and itches.  She has been taking Benadryl which helps some.  She stated that its not as bad now, but wanted to let us know and wandered if there was anything else she could take. I informed Dr. Logan Bores who is on call, he wants her to try Hydroxyzine 25mg  1 po bid prn.  I informed that patient of the new medication and instructed her to go to ED if any worsening symptoms start.  She verbalized instructions and medication has been sent to her pharmacy

## 2020-01-02 NOTE — Telephone Encounter (Signed)
"  I need help!  I am allergic to that medicine that he gave me.  I have a rash and it is spreading.  It's on my legs.  It itches like crazy.  I left a message for the nurse already but I haven't heard back from her yet."  Which medication is it?  Have you stopped taking it?  "It's the Prednisone.  I finished taking it about three days ago.  I have taken Benadryl but it has only helped a little."  I will let Angie, the nurse, know.  She has to contact your doctor and see what he recommends.  She'll call you back with a response.

## 2020-01-21 DIAGNOSIS — E7800 Pure hypercholesterolemia, unspecified: Secondary | ICD-10-CM | POA: Diagnosis not present

## 2020-01-21 DIAGNOSIS — R7303 Prediabetes: Secondary | ICD-10-CM | POA: Diagnosis not present

## 2020-01-27 ENCOUNTER — Telehealth: Payer: Self-pay | Admitting: Podiatry

## 2020-01-28 ENCOUNTER — Encounter: Payer: Medicare HMO | Admitting: Orthotics

## 2020-01-28 DIAGNOSIS — E7800 Pure hypercholesterolemia, unspecified: Secondary | ICD-10-CM | POA: Diagnosis not present

## 2020-01-28 DIAGNOSIS — R7303 Prediabetes: Secondary | ICD-10-CM | POA: Diagnosis not present

## 2020-01-28 DIAGNOSIS — M8589 Other specified disorders of bone density and structure, multiple sites: Secondary | ICD-10-CM | POA: Diagnosis not present

## 2020-03-12 DIAGNOSIS — J0181 Other acute recurrent sinusitis: Secondary | ICD-10-CM | POA: Diagnosis not present

## 2020-03-13 ENCOUNTER — Other Ambulatory Visit: Payer: Self-pay

## 2020-03-13 ENCOUNTER — Encounter: Payer: Self-pay | Admitting: Emergency Medicine

## 2020-03-13 ENCOUNTER — Ambulatory Visit
Admission: EM | Admit: 2020-03-13 | Discharge: 2020-03-13 | Disposition: A | Discharge status: Home / Self Care | Payer: Medicare HMO

## 2020-03-13 DIAGNOSIS — H5789 Other specified disorders of eye and adnexa: Secondary | ICD-10-CM

## 2020-03-13 DIAGNOSIS — H5712 Ocular pain, left eye: Secondary | ICD-10-CM | POA: Diagnosis not present

## 2020-03-13 DIAGNOSIS — H5711 Ocular pain, right eye: Secondary | ICD-10-CM | POA: Diagnosis not present

## 2020-03-13 DIAGNOSIS — T1512XA Foreign body in conjunctival sac, left eye, initial encounter: Secondary | ICD-10-CM | POA: Diagnosis not present

## 2020-03-13 DIAGNOSIS — H02843 Edema of right eye, unspecified eyelid: Secondary | ICD-10-CM

## 2020-03-13 MED ORDER — CIPROFLOXACIN HCL 0.3 % OP SOLN: 1.0000 [drp] | OPHTHALMIC | 0 refills | Status: AC

## 2020-03-13 NOTE — Discharge Instructions (Signed)
-  Today, I have used a cotton tip applicator to remove possible foreign body from the right eye. You stated that you experience some relief after this. Will sent antibiotic eye drop for possible infection of eye. Contact eye doctor Monday or go to ED for acute worsening of eye pain, swelling, fever, nausea/vomiting, dizziness, weakness  [facility] [address]  [phone]

## 2020-03-13 NOTE — ED Provider Notes (Signed)
[facility]    CSN: [id number] Arrival date & time: 03/13/20  8341      History   Chief Complaint Chief Complaint  Patient presents with   Eye Problem    HPI Valerie Berry is a 74 y.o. female.   Patient presents for bilateral eye swelling, pain, and redness. She says she might have got something in the eye 4 days ago when she was working outside. The following day she woke up with right eye redness and pain. The left eye is only watery. She says she has a sinus problem. She has been using allergy eye drops. She says she has never had an eye problem like this before. Admits to right sided headache. Admits to pain around the orbit. She says she is feeling a little better today, but there is still too much pressure. Patient did see her PCP yesterday and was diagnosed with recurrent sinusitis and prescribed doxycycline. She was advised to see an eye specialist. Patient denies history of eye issues other than having to wear glasses. Patient denies fever, fatigue, dizziness, nausea, vomiting. No other concerns today.     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      Home Medications    Prior to Admission medications   Medication Sig Start Date End Date  Taking? Authorizing Provider  atorvastatin (LIPITOR) 20 MG tablet Take by mouth. 01/06/16 03/13/20 Yes [provider]  doxycycline (VIBRA-TABS) 100 MG tablet Take by mouth. 03/12/20 03/22/20 Yes [provider]  ciprofloxacin (CILOXAN) 0.3 % ophthalmic solution Place 1 drop into the right eye every 2 (two) hours. Administer 1 drop, every 2 hours, while awake, for 2 days. Then 1 drop, every 4 hours, while awake, for the next 5 days. 03/13/20   Shirlee Latch, PA-C  diclofenac (VOLTAREN) 75 MG EC tablet Take 1 tablet (75 mg total) by mouth 2 (two) times daily. 01/15/19   Hyatt, Max T, DPM  hydrOXYzine (ATARAX/VISTARIL) 25 MG tablet Take 1 tablet (25 mg total) by mouth 3 (three) times daily. 01/02/20   Felecia Shelling, DPM  methylPREDNISolone (MEDROL DOSEPAK) 4 MG TBPK tablet 6 day dose pack - take as directed 12/24/19   Hyatt, Max T, DPM  Naproxen Sodium (ALEVE PO) Take by mouth.    [provider]    Family History History reviewed. No pertinent family history.  Social History Social History   Tobacco Use   Smoking status: Never Smoker   Smokeless tobacco: Never Used  Building services engineer Use: Never used  Substance Use Topics   Alcohol use: No   Drug use: Never     Allergies   Meloxicam, Pollen extract, Amoxicillin, and Prednisolone   Review of Systems Review of Systems  Constitutional: Negative  for fatigue and fever.  HENT: Negative for congestion, ear pain, rhinorrhea, sinus pressure, sinus pain and sore throat.   Eyes: Positive for pain, discharge and redness. Negative for photophobia, itching and visual disturbance.  Respiratory: Negative for cough.   Gastrointestinal: Negative for nausea and vomiting.  Skin: Negative for rash.  Neurological: Negative for dizziness, weakness and headaches.     Physical Exam Triage Vital Signs ED Triage Vitals  Enc Vitals Group     BP 03/13/20 0956 (!) 127/53     Pulse Rate 03/13/20 0956 60     Resp 03/13/20  0956 14     Temp 03/13/20 0956 98.2 F (36.8 C)     Temp Source 03/13/20 0956 Oral     SpO2 03/13/20 0956 98 %     Weight 03/13/20 0953 140 lb (63.5 kg)     Height 03/13/20 0953 5\' 2"  (1.575 m)     Head Circumference --      Peak Flow --      Pain Score 03/13/20 0952 9     Pain Loc --      Pain Edu? --      Excl. in GC? --    No data found.  Updated Vital Signs BP (!) 127/53 (BP Location: Right Arm)    Pulse 60    Temp 98.2 F (36.8 C) (Oral)    Resp 14    Ht 5\' 2"  (1.575 m)    Wt 140 lb (63.5 kg)    SpO2 98%    BMI 25.61 kg/m   Visual Acuity Right Eye Distance: 20/100 corrected Left Eye Distance: 20/40 corrected Bilateral Distance: 20/40 corrected     Physical Exam Vitals and nursing note reviewed.  Constitutional:      General: She is not in acute distress.    Appearance: Normal appearance. She is not ill-appearing or toxic-appearing.  HENT:     Head: Normocephalic and atraumatic.     Nose: Nose normal.     Mouth/Throat:     Mouth: Mucous membranes are moist.     Pharynx: Oropharynx is clear.  Eyes:     General: Lids are everted, no foreign bodies appreciated. No scleral icterus.       Right eye: No discharge.        Left eye: No discharge.     Conjunctiva/sclera:     Right eye: Right conjunctiva is injected. Chemosis present.     Comments: Moderate swelling upper and lower right eyelids, mild swelling of both eyelids on left  Cardiovascular:     Rate and Rhythm: Normal rate and regular rhythm.     Heart sounds: Normal heart sounds.  Pulmonary:     Effort: Pulmonary effort is normal. No respiratory distress.     Breath sounds: Normal breath sounds.  Musculoskeletal:     Cervical back: Neck supple.  Skin:    General: Skin is dry.  Neurological:     General: No focal deficit present.     Mental Status: She is alert. Mental status is at baseline.     Motor: No weakness.     Gait: Gait normal.  Psychiatric:        Mood and Affect: Mood normal.         Behavior: Behavior normal.        Thought Content: Thought content normal.      UC Treatments / Results  Labs (all labs ordered are listed, but only abnormal results are displayed) Labs Reviewed -  No data to display  EKG   Radiology No results found.  Procedures Foreign Body Removal  Date/Time: 03/13/2020 3:29 PM Performed by: Shirlee Latch, PA-C Authorized by: Shirlee Latch, PA-C   Consent:    Consent obtained:  Verbal   Consent given by:  Patient   Risks discussed:  Pain and worsening of condition   Alternatives discussed:  Alternative treatment, no treatment, observation and referral Location:    Location:  Face   Face location:  L eyelid Anesthesia (see MAR for exact dosages):    Anesthesia method:  Topical application   Topical anesthesia: tetracaine drops (1) Procedure details:    Removal mechanism: cotton tip applicator.   Foreign bodies recovered:  1   Description:  2 mm white FB, unknown type   Intact foreign body removal: yes   Comments:     Removed from left lower conjunctiva    (including critical care time)  Medications Ordered in UC Medications - No data to display  Initial Impression / Assessment and Plan / UC Course  I have reviewed the triage vital signs and the nursing notes.  Pertinent labs & imaging results that were available during my care of the patient were reviewed by me and considered in my medical decision making (see chart for details).    74 y/o female presenting for FB eye, removed in clinic. Pt reported improvement in condition. Sent antibiotic eye drops to pharmacy for possible infection since conjunctiva erythematous, chemosis present and swelling of eye. ED precautions discussed and advised her to follow up with eye specialist  Final Clinical Impressions(s) / UC Diagnoses   Final diagnoses:  Acute right eye pain  Swelling of right eye  Foreign body of left conjunctiva, initial encounter     Discharge Instructions       -Today, I have used a cotton tip applicator to remove possible foreign body from the right eye. You stated that you experience some relief after this. Will sent antibiotic eye drop for possible infection of eye. Contact eye doctor Monday or go to ED for acute worsening of eye pain, swelling, fever, nausea/vomiting, dizziness, weakness  [facility] [address]  [phone]    ED Prescriptions    Medication Sig Dispense Auth. Provider   ciprofloxacin (CILOXAN) 0.3 % ophthalmic solution Place 1 drop into the right eye every 2 (two) hours. Administer 1 drop, every 2 hours, while awake, for 2 days. Then 1 drop, every 4 hours, while awake, for the next 5 days. 5 mL Shirlee Latch, PA-C     PDMP not reviewed this encounter.   Shirlee Latch, PA-C 03/13/20 1535

## 2020-03-13 NOTE — ED Triage Notes (Signed)
Patient c/o bilateral eye swelling and redness for the past 4 days.  Patient states that she saw her PCP yesterday and was given oral antibiotic.  Patient reports that she has increased pressure on the right side of her eye.

## 2020-03-15 DIAGNOSIS — H2513 Age-related nuclear cataract, bilateral: Secondary | ICD-10-CM | POA: Diagnosis not present

## 2020-03-26 DIAGNOSIS — H2000 Unspecified acute and subacute iridocyclitis: Secondary | ICD-10-CM | POA: Diagnosis not present

## 2020-04-09 DIAGNOSIS — H2000 Unspecified acute and subacute iridocyclitis: Secondary | ICD-10-CM | POA: Diagnosis not present

## 2020-05-12 DIAGNOSIS — M810 Age-related osteoporosis without current pathological fracture: Secondary | ICD-10-CM | POA: Diagnosis not present

## 2020-07-20 DIAGNOSIS — M722 Plantar fascial fibromatosis: Secondary | ICD-10-CM | POA: Diagnosis not present

## 2020-07-28 DIAGNOSIS — E7800 Pure hypercholesterolemia, unspecified: Secondary | ICD-10-CM | POA: Diagnosis not present

## 2020-07-28 DIAGNOSIS — R7303 Prediabetes: Secondary | ICD-10-CM | POA: Diagnosis not present

## 2020-08-04 DIAGNOSIS — E785 Hyperlipidemia, unspecified: Secondary | ICD-10-CM | POA: Diagnosis not present

## 2020-08-04 DIAGNOSIS — Z0000 Encounter for general adult medical examination without abnormal findings: Secondary | ICD-10-CM | POA: Diagnosis not present

## 2020-08-04 DIAGNOSIS — R7303 Prediabetes: Secondary | ICD-10-CM | POA: Diagnosis not present

## 2020-10-06 DIAGNOSIS — M722 Plantar fascial fibromatosis: Secondary | ICD-10-CM | POA: Diagnosis not present

## 2021-01-25 DIAGNOSIS — R7303 Prediabetes: Secondary | ICD-10-CM | POA: Diagnosis not present

## 2021-01-25 DIAGNOSIS — E7800 Pure hypercholesterolemia, unspecified: Secondary | ICD-10-CM | POA: Diagnosis not present

## 2021-01-28 DIAGNOSIS — U071 COVID-19: Secondary | ICD-10-CM | POA: Diagnosis not present

## 2021-01-28 DIAGNOSIS — Z20822 Contact with and (suspected) exposure to covid-19: Secondary | ICD-10-CM | POA: Diagnosis not present

## 2021-03-09 DIAGNOSIS — M25512 Pain in left shoulder: Secondary | ICD-10-CM | POA: Diagnosis not present

## 2021-03-09 DIAGNOSIS — E7800 Pure hypercholesterolemia, unspecified: Secondary | ICD-10-CM | POA: Diagnosis not present

## 2021-03-09 DIAGNOSIS — R7303 Prediabetes: Secondary | ICD-10-CM | POA: Diagnosis not present

## 2021-04-11 DIAGNOSIS — H2513 Age-related nuclear cataract, bilateral: Secondary | ICD-10-CM | POA: Diagnosis not present

## 2021-04-20 NOTE — Telephone Encounter (Signed)
error 

## 2021-05-03 DIAGNOSIS — H2511 Age-related nuclear cataract, right eye: Secondary | ICD-10-CM | POA: Diagnosis not present

## 2021-05-04 ENCOUNTER — Encounter: Payer: Self-pay | Admitting: Ophthalmology

## 2021-05-16 NOTE — Discharge Instructions (Signed)

## 2021-05-17 ENCOUNTER — Ambulatory Visit
Admission: RE | Admit: 2021-05-17 | Discharge: 2021-05-17 | Disposition: A | Discharge status: Home / Self Care | Payer: Medicare HMO | Attending: Ophthalmology | Admitting: Ophthalmology

## 2021-05-17 ENCOUNTER — Ambulatory Visit: Payer: Medicare HMO | Admitting: Anesthesiology

## 2021-05-17 ENCOUNTER — Other Ambulatory Visit: Payer: Self-pay

## 2021-05-17 ENCOUNTER — Encounter: Payer: Self-pay | Admitting: Ophthalmology

## 2021-05-17 ENCOUNTER — Encounter
Admission: RE | Disposition: A | Discharge status: Home / Self Care | Payer: Self-pay | Source: Home / Self Care | Attending: Ophthalmology

## 2021-05-17 DIAGNOSIS — Z880 Allergy status to penicillin: Secondary | ICD-10-CM | POA: Insufficient documentation

## 2021-05-17 DIAGNOSIS — Z79899 Other long term (current) drug therapy: Secondary | ICD-10-CM | POA: Diagnosis not present

## 2021-05-17 DIAGNOSIS — Z888 Allergy status to other drugs, medicaments and biological substances status: Secondary | ICD-10-CM | POA: Diagnosis not present

## 2021-05-17 DIAGNOSIS — H25811 Combined forms of age-related cataract, right eye: Secondary | ICD-10-CM | POA: Diagnosis not present

## 2021-05-17 DIAGNOSIS — H2511 Age-related nuclear cataract, right eye: Secondary | ICD-10-CM | POA: Diagnosis not present

## 2021-05-17 DIAGNOSIS — Z791 Long term (current) use of non-steroidal anti-inflammatories (NSAID): Secondary | ICD-10-CM | POA: Insufficient documentation

## 2021-05-17 HISTORY — PX: CATARACT EXTRACTION W/PHACO: SHX586

## 2021-05-17 SURGERY — PHACOEMULSIFICATION, CATARACT, WITH IOL INSERTION
Anesthesia: Monitor Anesthesia Care | Site: Eye | Laterality: Right

## 2021-05-17 MED ORDER — SIGHTPATH DOSE#1 BSS IO SOLN
INTRAOCULAR | Status: DC | PRN
  Administered 2021-05-17: 15 mL

## 2021-05-17 MED ORDER — TETRACAINE HCL 0.5 % OP SOLN
1.0000 [drp] | OPHTHALMIC | Status: DC | PRN
  Administered 2021-05-17 (×3): 1 [drp] via OPHTHALMIC

## 2021-05-17 MED ORDER — SIGHTPATH DOSE#1 BSS IO SOLN
INTRAOCULAR | Status: DC | PRN
  Administered 2021-05-17: 1 mL via INTRAMUSCULAR

## 2021-05-17 MED ORDER — SIGHTPATH DOSE#1 BSS IO SOLN
INTRAOCULAR | Status: DC | PRN
  Administered 2021-05-17: 64 mL via OPHTHALMIC

## 2021-05-17 MED ORDER — FENTANYL CITRATE (PF) 100 MCG/2ML IJ SOLN
INTRAMUSCULAR | Status: DC | PRN
  Administered 2021-05-17: 50 ug via INTRAVENOUS

## 2021-05-17 MED ORDER — LACTATED RINGERS IV SOLN: INTRAVENOUS | Status: DC

## 2021-05-17 MED ORDER — ARMC OPHTHALMIC DILATING DROPS
1.0000 "application " | OPHTHALMIC | Status: DC | PRN
  Administered 2021-05-17 (×3): 1 via OPHTHALMIC

## 2021-05-17 MED ORDER — MIDAZOLAM HCL 2 MG/2ML IJ SOLN
INTRAMUSCULAR | Status: DC | PRN
  Administered 2021-05-17: 1.5 mg via INTRAVENOUS

## 2021-05-17 MED ORDER — SIGHTPATH DOSE#1 NA CHONDROIT SULF-NA HYALURON 40-17 MG/ML IO SOLN
INTRAOCULAR | Status: DC | PRN
  Administered 2021-05-17: 1 mL via INTRAOCULAR

## 2021-05-17 SURGICAL SUPPLY — 13 items
CANNULA ANT/CHMB 27GA (MISCELLANEOUS) ×2 IMPLANT
GLOVE SURG ENC TEXT LTX SZ8 (GLOVE) ×2 IMPLANT
GLOVE SURG TRIUMPH 8.0 PF LTX (GLOVE) ×2 IMPLANT
GOWN STRL REUS W/ TWL LRG LVL3 (GOWN DISPOSABLE) ×2 IMPLANT
GOWN STRL REUS W/TWL LRG LVL3 (GOWN DISPOSABLE) ×4
LENS IOL TECNIS EYHANCE 25.0 (Intraocular Lens) ×2 IMPLANT
MARKER SKIN DUAL TIP RULER LAB (MISCELLANEOUS) ×2 IMPLANT
NEEDLE FILTER BLUNT 18X 1/2SAF (NEEDLE) ×1
NEEDLE FILTER BLUNT 18X1 1/2 (NEEDLE) ×1 IMPLANT
SYR 3ML LL SCALE MARK (SYRINGE) ×2 IMPLANT
SYR TB 1ML LUER SLIP (SYRINGE) ×2 IMPLANT
WATER STERILE IRR 250ML POUR (IV SOLUTION) ×2 IMPLANT
WIPE NON LINTING 3.25X3.25 (MISCELLANEOUS) ×2 IMPLANT

## 2021-05-17 NOTE — H&P (Signed)
[facility]   Primary Care Physician:  Medicine, [facility] Family Ophthalmologist: Dr. Druscilla Brownie  Pre-Procedure History & Physical: HPI:  Valerie Berry is a 75 y.o. female here for cataract surgery.   Past Medical History:  Diagnosis Date   High cholesterol     Past Surgical History:  Procedure Laterality Date   CESAREAN SECTION     COLONOSCOPY      Prior to Admission medications   Medication Sig Start Date End Date Taking? Authorizing Provider  atorvastatin (LIPITOR) 20 MG tablet Take by mouth. 01/06/16 05/17/21 Yes [provider]  CALCIUM-MAGNESIUM-VITAMIN D PO Take by mouth.   Yes [provider]  GLUCOSAMINE-CHONDROITIN PO Take by mouth.   Yes [provider]  KRILL OIL PO Take by mouth.   Yes [provider]  ciprofloxacin (CILOXAN) 0.3 % ophthalmic solution Place 1 drop into the right eye every 2 (two) hours. Administer 1 drop, every 2 hours, while awake, for 2 days. Then 1 drop, every 4 hours, while awake, for the next 5 days. Patient not taking: Reported on 05/04/2021 03/13/20   Eusebio Friendly B, PA-C    Allergies as of 04/12/2021 - Review Complete 03/13/2020  Allergen Reaction Noted   Meloxicam Other (See Comments) 04/04/2016   Pollen extract Other (See Comments) 04/17/2019   Amoxicillin Rash 11/23/2017   Prednisolone Rash 01/28/2020    History reviewed. No pertinent family history.  Social History   Socioeconomic History   Marital status: Unknown    Spouse name: Not on file   Number of children: Not on file   Years of education: Not on file   Highest education level: Not on file  Occupational History   Not on file  Tobacco Use   Smoking status: Never   Smokeless tobacco: Never  Vaping Use   Vaping Use: Never used  Substance and Sexual Activity   Alcohol use: No   Drug use: Never   Sexual activity: Not on file  Other Topics Concern   Not on file  Social History Narrative   ** Merged History Encounter **        Social Determinants of Health   Financial Resource Strain: Not on file  Food Insecurity: Not on file  Transportation Needs: Not on file  Physical Activity: Not on file  Stress: Not on file  Social Connections: Not on file  Intimate Partner Violence: Not on file    Review of Systems: See HPI, otherwise negative ROS  Physical Exam: BP 127/69   Pulse (!) 51   Temp 97.7 F (36.5 C) (Temporal)   Resp (!) 22   Ht 5\' 2"  (1.575 m)   Wt 59 kg   SpO2 96%   BMI 23.78 kg/m  General:   Alert, cooperative in NAD Head:  Normocephalic and atraumatic. Respiratory:  Normal work of breathing. Cardiovascular:  RRR  Impression/Plan: is here for cataract surgery.  Risks, benefits, limitations, and alternatives regarding cataract surgery have been reviewed with the patient.  Questions have been answered.  All parties agreeable.   Kennedy Bucker, MD  05/17/2021, 11:35 AM

## 2021-05-17 NOTE — Anesthesia Preprocedure Evaluation (Signed)
Anesthesia Evaluation  Patient identified by MRN, date of birth, ID band Patient awake    Reviewed: NPO status   History of Anesthesia Complications Negative for: history of anesthetic complications  Airway Mallampati: II  TM Distance: >3 FB Neck ROM: full    Dental no notable dental hx.    Pulmonary neg pulmonary ROS,    Pulmonary exam normal        Cardiovascular Exercise Tolerance: Good Normal cardiovascular exam  HLD   Neuro/Psych negative neurological ROS  negative psych ROS   GI/Hepatic Neg liver ROS, GERD  Controlled,  Endo/Other  negative endocrine ROS  Renal/GU negative Renal ROS  negative genitourinary   Musculoskeletal  (+) Arthritis ,   Abdominal   Peds  Hematology negative hematology ROS (+)   Anesthesia Other Findings   Reproductive/Obstetrics                             Anesthesia Physical Anesthesia Plan  ASA: 2  Anesthesia Plan: MAC   Post-op Pain Management:    Induction:   PONV Risk Score and Plan: 2 and Midazolam and TIVA  Airway Management Planned:   Additional Equipment:   Intra-op Plan:   Post-operative Plan:   Informed Consent: I have reviewed the patients History and Physical, chart, labs and discussed the procedure including the risks, benefits and alternatives for the proposed anesthesia with the patient or authorized representative who has indicated his/her understanding and acceptance.       Plan Discussed with: CRNA  Anesthesia Plan Comments:         Anesthesia Quick Evaluation

## 2021-05-17 NOTE — Anesthesia Postprocedure Evaluation (Signed)
Anesthesia Post Note  Patient: Valerie Berry  Procedure(s) Performed: CATARACT EXTRACTION PHACO AND INTRAOCULAR LENS PLACEMENT (IOC) RIGHT (Right: Eye)     Patient location during evaluation: PACU Anesthesia Type: MAC Level of consciousness: awake and alert Pain management: pain level controlled Vital Signs Assessment: post-procedure vital signs reviewed and stable Respiratory status: spontaneous breathing, nonlabored ventilation, respiratory function stable and patient connected to nasal cannula oxygen Cardiovascular status: stable and blood pressure returned to baseline Postop Assessment: no apparent nausea or vomiting Anesthetic complications: no   No notable events documented.  Fidel Levy

## 2021-05-17 NOTE — Anesthesia Procedure Notes (Signed)
Date/Time: 05/17/2021 11:46 AM Performed by: Jimmy Picket, CRNA Pre-anesthesia Checklist: Patient identified, Emergency Drugs available, Suction available, Timeout performed and Patient being monitored Patient Re-evaluated:Patient Re-evaluated prior to induction Oxygen Delivery Method: Nasal cannula Placement Confirmation: positive ETCO2

## 2021-05-17 NOTE — Op Note (Signed)
PREOPERATIVE DIAGNOSIS:  Nuclear sclerotic cataract of the right eye.   POSTOPERATIVE DIAGNOSIS:  H25.11 Cataract   OPERATIVE PROCEDURE:ORPROCALL@   SURGEON:  Galen Manila, MD.   ANESTHESIA:  Anesthesiologist: Orrin Brigham, MD CRNA: Jimmy Picket, CRNA  1.      Managed anesthesia care. 2.      0.56ml of Shugarcaine was instilled in the eye following the paracentesis.   COMPLICATIONS:  None.   TECHNIQUE:   Stop and chop   DESCRIPTION OF PROCEDURE:  The patient was examined and consented in the preoperative holding area where the aforementioned topical anesthesia was applied to the right eye and then brought back to the Operating Room where the right eye was prepped and draped in the usual sterile ophthalmic fashion and a lid speculum was placed. A paracentesis was created with the side port blade and the anterior chamber was filled with viscoelastic. A near clear corneal incision was performed with the steel keratome. A continuous curvilinear capsulorrhexis was performed with a cystotome followed by the capsulorrhexis forceps. Hydrodissection and hydrodelineation were carried out with BSS on a blunt cannula. The lens was removed in a stop and chop  technique and the remaining cortical material was removed with the irrigation-aspiration handpiece. The capsular bag was inflated with viscoelastic and the Technis [id number]  lens was placed in the capsular bag without complication. The remaining viscoelastic was removed from the eye with the irrigation-aspiration handpiece. The wounds were hydrated. The anterior chamber was flushed with BSS and the eye was inflated to physiologic pressure. 0.5ml of Vigamox was placed in the anterior chamber. The wounds were found to be water tight. The eye was dressed with Combigan. The patient was given protective glasses to wear throughout the day and a shield with which to sleep tonight. The patient was also given drops with which to begin a drop regimen today  and will follow-up with me in one day. Implant Name Type Inv. Item Serial No. Manufacturer Lot No. LRB No. Used Action  LENS IOL TECNIS EYHANCE 25.0 - [id number] Intraocular Lens LENS IOL TECNIS EYHANCE 25.0 [id number] JOHNSON   Right 1 Implanted   Procedure(s) with comments: CATARACT EXTRACTION PHACO AND INTRAOCULAR LENS PLACEMENT (IOC) RIGHT (Right) - 17.16 01:28.9  Electronically signed: Galen Manila 05/17/2021 12:02 PM

## 2021-05-17 NOTE — Transfer of Care (Signed)
Immediate Anesthesia Transfer of Care Note  Patient: Valerie Berry  Procedure(s) Performed: CATARACT EXTRACTION PHACO AND INTRAOCULAR LENS PLACEMENT (IOC) RIGHT (Right: Eye)  Patient Location: PACU  Anesthesia Type: MAC  Level of Consciousness: awake, alert  and patient cooperative  Airway and Oxygen Therapy: Patient Spontanous Breathing and Patient connected to supplemental oxygen  Post-op Assessment: Post-op Vital signs reviewed, Patient's Cardiovascular Status Stable, Respiratory Function Stable, Patent Airway and No signs of Nausea or vomiting  Post-op Vital Signs: Reviewed and stable  Complications: No notable events documented.

## 2021-05-18 ENCOUNTER — Encounter: Payer: Self-pay | Admitting: Ophthalmology

## 2021-05-26 DIAGNOSIS — H2512 Age-related nuclear cataract, left eye: Secondary | ICD-10-CM | POA: Diagnosis not present

## 2021-05-30 NOTE — Discharge Instructions (Signed)

## 2021-05-31 ENCOUNTER — Other Ambulatory Visit: Payer: Self-pay

## 2021-05-31 ENCOUNTER — Ambulatory Visit
Admission: RE | Admit: 2021-05-31 | Discharge: 2021-05-31 | Disposition: A | Discharge status: Home / Self Care | Payer: Medicare HMO | Attending: Ophthalmology | Admitting: Ophthalmology

## 2021-05-31 ENCOUNTER — Encounter
Admission: RE | Disposition: A | Discharge status: Home / Self Care | Payer: Self-pay | Source: Home / Self Care | Attending: Ophthalmology

## 2021-05-31 ENCOUNTER — Ambulatory Visit: Payer: Medicare HMO | Admitting: Anesthesiology

## 2021-05-31 ENCOUNTER — Encounter: Payer: Self-pay | Admitting: Ophthalmology

## 2021-05-31 DIAGNOSIS — H2512 Age-related nuclear cataract, left eye: Secondary | ICD-10-CM | POA: Insufficient documentation

## 2021-05-31 DIAGNOSIS — H25812 Combined forms of age-related cataract, left eye: Secondary | ICD-10-CM | POA: Diagnosis not present

## 2021-05-31 HISTORY — PX: CATARACT EXTRACTION W/PHACO: SHX586

## 2021-05-31 SURGERY — PHACOEMULSIFICATION, CATARACT, WITH IOL INSERTION
Anesthesia: Monitor Anesthesia Care | Site: Eye | Laterality: Left

## 2021-05-31 MED ORDER — SIGHTPATH DOSE#1 BSS IO SOLN
INTRAOCULAR | Status: DC | PRN
  Administered 2021-05-31: 1 mL

## 2021-05-31 MED ORDER — ARMC OPHTHALMIC DILATING DROPS
1.0000 "application " | OPHTHALMIC | Status: DC | PRN
  Administered 2021-05-31 (×3): 1 via OPHTHALMIC

## 2021-05-31 MED ORDER — SIGHTPATH DOSE#1 NA CHONDROIT SULF-NA HYALURON 40-17 MG/ML IO SOLN
INTRAOCULAR | Status: DC | PRN
  Administered 2021-05-31: 1 mL via INTRAOCULAR

## 2021-05-31 MED ORDER — ACETAMINOPHEN 325 MG PO TABS: 325.0000 mg | ORAL_TABLET | ORAL | Status: DC | PRN

## 2021-05-31 MED ORDER — MIDAZOLAM HCL 2 MG/2ML IJ SOLN
INTRAMUSCULAR | Status: DC | PRN
  Administered 2021-05-31: 1 mg via INTRAVENOUS

## 2021-05-31 MED ORDER — CEFUROXIME OPHTHALMIC INJECTION 1 MG/0.1 ML
INJECTION | OPHTHALMIC | Status: DC | PRN
  Administered 2021-05-31: 0.1 mL via INTRACAMERAL

## 2021-05-31 MED ORDER — ACETAMINOPHEN 160 MG/5ML PO SOLN: 325.0000 mg | ORAL | Status: DC | PRN

## 2021-05-31 MED ORDER — BRIMONIDINE TARTRATE-TIMOLOL 0.2-0.5 % OP SOLN
OPHTHALMIC | Status: DC | PRN
  Administered 2021-05-31: 1 [drp] via OPHTHALMIC

## 2021-05-31 MED ORDER — SIGHTPATH DOSE#1 BSS IO SOLN
INTRAOCULAR | Status: DC | PRN
  Administered 2021-05-31: 67 mL via OPHTHALMIC

## 2021-05-31 MED ORDER — LACTATED RINGERS IV SOLN: INTRAVENOUS | Status: DC

## 2021-05-31 MED ORDER — FENTANYL CITRATE (PF) 100 MCG/2ML IJ SOLN
INTRAMUSCULAR | Status: DC | PRN
  Administered 2021-05-31: 25 ug via INTRAVENOUS

## 2021-05-31 MED ORDER — TETRACAINE HCL 0.5 % OP SOLN
1.0000 [drp] | OPHTHALMIC | Status: DC | PRN
  Administered 2021-05-31 (×3): 1 [drp] via OPHTHALMIC

## 2021-05-31 MED ORDER — SIGHTPATH DOSE#1 BSS IO SOLN
INTRAOCULAR | Status: DC | PRN
  Administered 2021-05-31: 15 mL

## 2021-05-31 MED ORDER — ONDANSETRON HCL 4 MG/2ML IJ SOLN: 4.0000 mg | Freq: Once | INTRAMUSCULAR | Status: DC | PRN

## 2021-05-31 SURGICAL SUPPLY — 17 items
CANNULA ANT/CHMB 27GA (MISCELLANEOUS) IMPLANT
GLOVE SURG ENC TEXT LTX SZ8 (GLOVE) ×3 IMPLANT
GLOVE SURG TRIUMPH 8.0 PF LTX (GLOVE) ×3 IMPLANT
GOWN STRL REUS W/ TWL LRG LVL3 (GOWN DISPOSABLE) ×2 IMPLANT
GOWN STRL REUS W/TWL LRG LVL3 (GOWN DISPOSABLE) ×6
LENS IOL TECNIS EYHANCE 25.5 (Intraocular Lens) ×3 IMPLANT
MARKER SKIN DUAL TIP RULER LAB (MISCELLANEOUS) IMPLANT
NEEDLE FILTER BLUNT 18X 1/2SAF (NEEDLE) ×2
NEEDLE FILTER BLUNT 18X1 1/2 (NEEDLE) ×1 IMPLANT
PACK EYE AFTER SURG (MISCELLANEOUS) IMPLANT
RING MALYGIN (MISCELLANEOUS) IMPLANT
SUT ETHILON 10-0 CS-B-6CS-B-6 (SUTURE)
SUTURE EHLN 10-0 CS-B-6CS-B-6 (SUTURE) IMPLANT
SYR 3ML LL SCALE MARK (SYRINGE) ×3 IMPLANT
SYR TB 1ML LUER SLIP (SYRINGE) ×3 IMPLANT
WATER STERILE IRR 250ML POUR (IV SOLUTION) ×3 IMPLANT
WIPE NON LINTING 3.25X3.25 (MISCELLANEOUS) IMPLANT

## 2021-05-31 NOTE — Transfer of Care (Signed)
Immediate Anesthesia Transfer of Care Note  Patient: Valerie Berry  Procedure(s) Performed: CATARACT EXTRACTION PHACO AND INTRAOCULAR LENS PLACEMENT (IOC) LEFT (Left: Eye)  Patient Location: PACU  Anesthesia Type: MAC  Level of Consciousness: awake, alert  and patient cooperative  Airway and Oxygen Therapy: Patient Spontanous Breathing and Patient connected to supplemental oxygen  Post-op Assessment: Post-op Vital signs reviewed, Patient's Cardiovascular Status Stable, Respiratory Function Stable, Patent Airway and No signs of Nausea or vomiting  Post-op Vital Signs: Reviewed and stable  Complications: No notable events documented.

## 2021-05-31 NOTE — Anesthesia Procedure Notes (Signed)
Procedure Name: MAC Date/Time: 05/31/2021 8:50 AM Performed by: Dionne Bucy, CRNA Pre-anesthesia Checklist: Patient identified, Emergency Drugs available, Suction available, Patient being monitored and Timeout performed Patient Re-evaluated:Patient Re-evaluated prior to induction Oxygen Delivery Method: Nasal cannula Placement Confirmation: positive ETCO2

## 2021-05-31 NOTE — H&P (Signed)
[facility]   Primary Care Physician:  Medicine, [facility] Family Ophthalmologist: Dr. Druscilla Brownie  Pre-Procedure History & Physical: HPI:  Valerie Berry is a 75 y.o. female here for cataract surgery.   Past Medical History:  Diagnosis Date   High cholesterol     Past Surgical History:  Procedure Laterality Date   CATARACT EXTRACTION W/PHACO Right 05/17/2021   Procedure: CATARACT EXTRACTION PHACO AND INTRAOCULAR LENS PLACEMENT (IOC) RIGHT;  Surgeon: Galen Manila, MD;  Location: [facility];  Service: Ophthalmology;  Laterality: Right;  17.16 01:28.9   CESAREAN SECTION     COLONOSCOPY      Prior to Admission medications   Medication Sig Start Date End Date Taking? Authorizing Provider  CALCIUM-MAGNESIUM-VITAMIN D PO Take by mouth.   Yes [provider]  GLUCOSAMINE-CHONDROITIN PO Take by mouth.   Yes [provider]  KRILL OIL PO Take by mouth.   Yes [provider]  atorvastatin (LIPITOR) 20 MG tablet Take by mouth. 01/06/16 05/17/21  [provider]  ciprofloxacin (CILOXAN) 0.3 % ophthalmic solution Place 1 drop into the right eye every 2 (two) hours. Administer 1 drop, every 2 hours, while awake, for 2 days. Then 1 drop, every 4 hours, while awake, for the next 5 days. Patient not taking: Reported on 05/04/2021 03/13/20   Eusebio Friendly B, PA-C    Allergies as of 04/12/2021 - Review Complete 03/13/2020  Allergen Reaction Noted   Meloxicam Other (See Comments) 04/04/2016   Pollen extract Other (See Comments) 04/17/2019   Amoxicillin Rash 11/23/2017   Prednisolone Rash 01/28/2020    History reviewed. No pertinent family history.  Social History   Socioeconomic History   Marital status: Unknown    Spouse name: Not on file   Number of children: Not on file   Years of education: Not on file   Highest education level: Not on file  Occupational History   Not on file  Tobacco Use   Smoking status: Never   Smokeless  tobacco: Never  Vaping Use   Vaping Use: Never used  Substance and Sexual Activity   Alcohol use: No   Drug use: Never   Sexual activity: Not on file  Other Topics Concern   Not on file  Social History Narrative   ** Merged History Encounter **       Social Determinants of Health   Financial Resource Strain: Not on file  Food Insecurity: Not on file  Transportation Needs: Not on file  Physical Activity: Not on file  Stress: Not on file  Social Connections: Not on file  Intimate Partner Violence: Not on file    Review of Systems: See HPI, otherwise negative ROS  Physical Exam: BP (!) 126/59   Pulse (!) 51   Temp (!) 97 F (36.1 C) (Temporal)   Resp 16   Ht 5\' 2"  (1.575 m)   Wt 59 kg   SpO2 96%   BMI 23.78 kg/m  General:   Alert, cooperative in NAD Head:  Normocephalic and atraumatic. Respiratory:  Normal work of breathing. Cardiovascular:  RRR  Impression/Plan: is here for cataract surgery.  Risks, benefits, limitations, and alternatives regarding cataract surgery have been reviewed with the patient.  Questions have been answered.  All parties agreeable.   Kennedy Bucker, MD  05/31/2021, 8:40 AM

## 2021-05-31 NOTE — Anesthesia Postprocedure Evaluation (Signed)
Anesthesia Post Note  Patient: Valerie Berry  Procedure(s) Performed: CATARACT EXTRACTION PHACO AND INTRAOCULAR LENS PLACEMENT (IOC) LEFT (Left: Eye)     Patient location during evaluation: PACU Anesthesia Type: MAC Level of consciousness: awake and alert Pain management: pain level controlled Vital Signs Assessment: post-procedure vital signs reviewed and stable Respiratory status: spontaneous breathing, nonlabored ventilation, respiratory function stable and patient connected to nasal cannula oxygen Cardiovascular status: stable and blood pressure returned to baseline Postop Assessment: no apparent nausea or vomiting Anesthetic complications: no   No notable events documented.  Sinda Du

## 2021-05-31 NOTE — Op Note (Signed)
PREOPERATIVE DIAGNOSIS:  Nuclear sclerotic cataract of the left eye.   POSTOPERATIVE DIAGNOSIS:  Nuclear sclerotic cataract of the left eye.   OPERATIVE PROCEDURE:ORPROCALL@   SURGEON:  Galen Manila, MD.   ANESTHESIA:  Anesthesiologist: Edwyna Ready, MD CRNA: Lily Kocher, CRNA  1.      Managed anesthesia care. 2.     0.97ml of Shugarcaine was instilled following the paracentesis   COMPLICATIONS:  None.   TECHNIQUE:   Stop and chop   DESCRIPTION OF PROCEDURE:  The patient was examined and consented in the preoperative holding area where the aforementioned topical anesthesia was applied to the left eye and then brought back to the Operating Room where the left eye was prepped and draped in the usual sterile ophthalmic fashion and a lid speculum was placed. A paracentesis was created with the side port blade and the anterior chamber was filled with viscoelastic. A near clear corneal incision was performed with the steel keratome. A continuous curvilinear capsulorrhexis was performed with a cystotome followed by the capsulorrhexis forceps. Hydrodissection and hydrodelineation were carried out with BSS on a blunt cannula. The lens was removed in a stop and chop  technique and the remaining cortical material was removed with the irrigation-aspiration handpiece. The capsular bag was inflated with viscoelastic and the Technis [id number] lens was placed in the capsular bag without complication. The remaining viscoelastic was removed from the eye with the irrigation-aspiration handpiece. The wounds were hydrated. The anterior chamber was flushed with BSS and the eye was inflated to physiologic pressure. 0.74ml Vigamox was placed in the anterior chamber. The wounds were found to be water tight. The eye was dressed with Combigan. The patient was given protective glasses to wear throughout the day and a shield with which to sleep tonight. The patient was also given drops with which to begin a drop regimen  today and will follow-up with me in one day. Implant Name Type Inv. Item Serial No. Manufacturer Lot No. LRB No. Used Action  LENS IOL TECNIS EYHANCE 25.5 - [id number] Intraocular Lens LENS IOL TECNIS EYHANCE 25.5 [id number] JOHNSON   Left 1 Implanted    Procedure(s) with comments: CATARACT EXTRACTION PHACO AND INTRAOCULAR LENS PLACEMENT (IOC) LEFT (Left) - 9.91 1:03.0  Electronically signed: Galen Manila 05/31/2021 9:06 AM

## 2021-05-31 NOTE — Anesthesia Preprocedure Evaluation (Signed)
Anesthesia Evaluation  Patient identified by MRN, date of birth, ID band Patient awake    Reviewed: NPO status   History of Anesthesia Complications Negative for: history of anesthetic complications  Airway Mallampati: II  TM Distance: >3 FB Neck ROM: full    Dental no notable dental hx.    Pulmonary neg pulmonary ROS,    Pulmonary exam normal breath sounds clear to auscultation       Cardiovascular Exercise Tolerance: Good Normal cardiovascular exam Rhythm:Regular Rate:Normal  HLD   Neuro/Psych negative neurological ROS  negative psych ROS   GI/Hepatic GERD  Controlled,El;evated LFT   Endo/Other  negative endocrine ROS  Renal/GU negative Renal ROS  negative genitourinary   Musculoskeletal  (+) Arthritis ,   Abdominal Normal abdominal exam  (+) - obese,  Abdomen: soft.    Peds  Hematology negative hematology ROS (+)   Anesthesia Other Findings   Reproductive/Obstetrics                             Anesthesia Physical  Anesthesia Plan  ASA: 3  Anesthesia Plan: MAC   Post-op Pain Management:    Induction:   PONV Risk Score and Plan: 2 and Midazolam, TIVA and Treatment may vary due to age or medical condition  Airway Management Planned: Natural Airway and Nasal Cannula  Additional Equipment:   Intra-op Plan:   Post-operative Plan:   Informed Consent: I have reviewed the patients History and Physical, chart, labs and discussed the procedure including the risks, benefits and alternatives for the proposed anesthesia with the patient or authorized representative who has indicated his/her understanding and acceptance.       Plan Discussed with: CRNA  Anesthesia Plan Comments:         Anesthesia Quick Evaluation  Patient Active Problem List   Diagnosis Date Noted  . Elevated alanine aminotransferase (ALT) level 04/19/2018  . Hematuria, microscopic 04/19/2018   . 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 Chronic pain of left knee 02/05/2017  . GERD without esophagitis 02/05/2017  . Chest pain at rest 03/14/2016  . Prediabetes 03/14/2016  . Plantar fasciitis, bilateral 02/28/2016  . Elevated liver enzymes 12/27/2015  . Osteopenia of multiple sites 12/27/2015  . Primary osteoarthritis of left knee 12/27/2015  . Hypercholesteremia 04/24/2011  . Sinusitis 04/05/2011    CBC Latest Ref Rng & Units 04/05/2016  WBC 3.6 - 11.0 K/uL 7.8  Hemoglobin 12.0 - 16.0 g/dL 14.2  Hematocrit 35.0 - 47.0 % 41.4  Platelets 150 - 440 K/uL 233   BMP Latest Ref Rng & Units 04/05/2016  Glucose 65 - 99 mg/dL 101(H)  BUN 6 - 20 mg/dL 22(H)  Creatinine 0.44 - 1.00 mg/dL 0.62  Sodium 135 - 145 mmol/L 138  Potassium 3.5 - 5.1 mmol/L 3.5  Chloride 101 - 111 mmol/L 106  CO2 22 - 32 mmol/L 23  Calcium 8.9 - 10.3 mg/dL 9.4    Risks and benefits of anesthesia discussed at length, patient or surrogate demonstrates understanding. Appropriately NPO. Plan to proceed with anesthesia.  Champ Mungo, MD 05/31/21

## 2021-06-01 ENCOUNTER — Encounter: Payer: Self-pay | Admitting: Ophthalmology

## 2021-08-31 DIAGNOSIS — R7303 Prediabetes: Secondary | ICD-10-CM | POA: Diagnosis not present

## 2021-08-31 DIAGNOSIS — E7800 Pure hypercholesterolemia, unspecified: Secondary | ICD-10-CM | POA: Diagnosis not present

## 2021-09-07 DIAGNOSIS — Z0000 Encounter for general adult medical examination without abnormal findings: Secondary | ICD-10-CM | POA: Diagnosis not present

## 2021-09-07 DIAGNOSIS — R7303 Prediabetes: Secondary | ICD-10-CM | POA: Diagnosis not present

## 2021-09-07 DIAGNOSIS — Z1389 Encounter for screening for other disorder: Secondary | ICD-10-CM | POA: Diagnosis not present

## 2021-09-07 DIAGNOSIS — E785 Hyperlipidemia, unspecified: Secondary | ICD-10-CM | POA: Diagnosis not present

## 2021-10-03 DIAGNOSIS — M722 Plantar fascial fibromatosis: Secondary | ICD-10-CM | POA: Diagnosis not present

## 2021-10-20 ENCOUNTER — Other Ambulatory Visit
Admission: RE | Admit: 2021-10-20 | Discharge: 2021-10-20 | Disposition: A | Discharge status: Home / Self Care | Payer: Medicare HMO | Source: Home / Self Care | Attending: Ophthalmology | Admitting: Ophthalmology

## 2021-10-20 ENCOUNTER — Ambulatory Visit
Admission: RE | Admit: 2021-10-20 | Discharge: 2021-10-20 | Disposition: A | Discharge status: Home / Self Care | Payer: Medicare HMO | Attending: Ophthalmology | Admitting: Ophthalmology

## 2021-10-20 ENCOUNTER — Ambulatory Visit
Admission: RE | Admit: 2021-10-20 | Discharge: 2021-10-20 | Disposition: A | Discharge status: Home / Self Care | Payer: Medicare HMO | Source: Ambulatory Visit | Attending: Ophthalmology | Admitting: Ophthalmology

## 2021-10-20 ENCOUNTER — Other Ambulatory Visit: Payer: Self-pay | Admitting: Ophthalmology

## 2021-10-20 DIAGNOSIS — H20029 Recurrent acute iridocyclitis, unspecified eye: Secondary | ICD-10-CM

## 2021-10-20 DIAGNOSIS — H2000 Unspecified acute and subacute iridocyclitis: Secondary | ICD-10-CM | POA: Diagnosis not present

## 2021-10-20 DIAGNOSIS — R918 Other nonspecific abnormal finding of lung field: Secondary | ICD-10-CM | POA: Diagnosis not present

## 2021-10-20 DIAGNOSIS — J8410 Pulmonary fibrosis, unspecified: Secondary | ICD-10-CM | POA: Diagnosis not present

## 2021-10-20 LAB — SEDIMENTATION RATE: Sed Rate: 7 mm/hr (ref 0–30)

## 2021-10-21 LAB — ANA: Anti Nuclear Antibody (ANA): NEGATIVE

## 2021-10-24 LAB — QUANTIFERON-TB GOLD PLUS (RQFGPL)
QuantiFERON Mitogen Value: >10 IU/mL
QuantiFERON Nil Value: 0 IU/mL
QuantiFERON TB1 Ag Value: >10 IU/mL
QuantiFERON TB2 Ag Value: >10 IU/mL

## 2021-10-24 LAB — QUANTIFERON-TB GOLD PLUS: QuantiFERON-TB Gold Plus: POSITIVE — AB

## 2021-10-25 LAB — ACETYLCHOLINE RECEPTOR AB, ALL
Acety choline binding ab: <0.03 nmol/L (ref 0.00–0.24)
Acetylchol Block Ab: 14 % (ref 0–25)
Acetylcholine Modulat Ab: 0 % (ref 0–45)

## 2021-10-26 ENCOUNTER — Telehealth: Payer: Self-pay

## 2021-10-26 ENCOUNTER — Ambulatory Visit: Payer: Medicare HMO

## 2021-10-26 ENCOUNTER — Other Ambulatory Visit: Payer: Self-pay | Admitting: Surgery

## 2021-10-26 VITALS — Ht 62.0 in | Wt 132.0 lb

## 2021-10-26 DIAGNOSIS — R7612 Nonspecific reaction to cell mediated immunity measurement of gamma interferon antigen response without active tuberculosis: Secondary | ICD-10-CM

## 2021-10-26 LAB — HLA-B27 ANTIGEN: HLA-B27: NEGATIVE

## 2021-10-26 NOTE — Progress Notes (Signed)
+  QFT: 10/20/2021 ?CXR: negative 10/20/2021 ?EPI: 10/26/2021 ? ?HIV: neg per report (see EPI) ?CBC: wnl 08/31/2021 ?LFTs: wnl 08/31/2021 ? ?Tuberculosis treatment orders  ?All patients are to be monitored per [place] and county TB policies.   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 Treat for latent TB per the following: ? ?Rifampin 600mg  daily by mouth x 4 months per Dr. .  ? ?Patient on statin, no labs needed for first visit as she has had labs recently, will get LFTs on subsequent visits or sooner if concerning symptoms arise. ? ?Alvester Morin, MD  ?

## 2021-10-26 NOTE — Telephone Encounter (Signed)
Pt returned phone call. Completed Epi.  Pt is interested in LTBI.   ?Dr. Irena Cords notified pt interested and clinic will need medication orders. ?

## 2021-10-26 NOTE — Telephone Encounter (Signed)
Phone call to both numbers listed in pt demographics; left voicemails at both stating that RN with [facility] is calling regarding recent results, please call Dwanna Goshert at [phone]. ? ?(Ref Dr. Abbie Sons message: ?She was seeing her dentist, for some reason got a QFT (in Epic) and was positive. CXR already done and negative. She is 75 and I wonder at her exposure history...if she does want tx for LTBI we can of course get her started. We could also do a repeat TB test for PPD appt and see if the QFT was a false positive if she has no obvious exposure history. ?Needs Epi. Want repeat TB testing if no risks discovered? Want LTBI?) ?

## 2021-10-26 NOTE — Progress Notes (Signed)
The information reviewed and documented was obtained during telephone visit with pt. ? ?Pt provided her ht and wt. ? ?See QFT Gold result dated 10/20/21, and chest x-ray dated 10/20/21 in Epic. ? ?During LTBI visit will need to get current wt. ?No HIV & syphilis tests results found in Epic. ?Communicated with Dr. Wyvonnia Lora, that LTBI medication orders would be needed. ? ?

## 2021-10-27 DIAGNOSIS — H2000 Unspecified acute and subacute iridocyclitis: Secondary | ICD-10-CM | POA: Diagnosis not present

## 2021-10-27 NOTE — Progress Notes (Signed)
See 10/27/21 phone note.  Pt decided not to start LTBI at this time. ?

## 2021-10-27 NOTE — Telephone Encounter (Signed)
Phone call received from pt 10/27/21. Pt stated she discussed LTBI with the eye doctor that ordered the QFT Gold test.  Pt also confirmed that LTBI medication is not required. Pt changed her mind about LTBI med and does not want to pursue it at this time.  Pt expresses understanding that she can call [facility] if she decides she wants LTBI in the future. ?

## 2021-11-15 NOTE — Progress Notes (Signed)
Pt has recurrent iritis. ? ?This antigen test is part of common screening for etiology of this condition ?

## 2021-11-17 DIAGNOSIS — H2000 Unspecified acute and subacute iridocyclitis: Secondary | ICD-10-CM | POA: Diagnosis not present

## 2021-11-28 DIAGNOSIS — L03113 Cellulitis of right upper limb: Secondary | ICD-10-CM | POA: Diagnosis not present

## 2021-11-28 DIAGNOSIS — W57XXXA Bitten or stung by nonvenomous insect and other nonvenomous arthropods, initial encounter: Secondary | ICD-10-CM | POA: Diagnosis not present

## 2021-11-28 DIAGNOSIS — H2000 Unspecified acute and subacute iridocyclitis: Secondary | ICD-10-CM | POA: Diagnosis not present

## 2021-12-22 DIAGNOSIS — H2000 Unspecified acute and subacute iridocyclitis: Secondary | ICD-10-CM | POA: Diagnosis not present

## 2022-02-20 DIAGNOSIS — H2000 Unspecified acute and subacute iridocyclitis: Secondary | ICD-10-CM | POA: Diagnosis not present

## 2022-03-02 DIAGNOSIS — R7303 Prediabetes: Secondary | ICD-10-CM | POA: Diagnosis not present

## 2022-03-02 DIAGNOSIS — T63481A Toxic effect of venom of other arthropod, accidental (unintentional), initial encounter: Secondary | ICD-10-CM | POA: Diagnosis not present

## 2022-03-02 DIAGNOSIS — E7800 Pure hypercholesterolemia, unspecified: Secondary | ICD-10-CM | POA: Diagnosis not present

## 2022-03-02 DIAGNOSIS — B9689 Other specified bacterial agents as the cause of diseases classified elsewhere: Secondary | ICD-10-CM | POA: Diagnosis not present

## 2022-03-02 DIAGNOSIS — J019 Acute sinusitis, unspecified: Secondary | ICD-10-CM | POA: Diagnosis not present

## 2022-03-10 ENCOUNTER — Other Ambulatory Visit: Payer: Self-pay | Admitting: Family Medicine

## 2022-03-10 DIAGNOSIS — R7303 Prediabetes: Secondary | ICD-10-CM | POA: Diagnosis not present

## 2022-03-10 DIAGNOSIS — M810 Age-related osteoporosis without current pathological fracture: Secondary | ICD-10-CM | POA: Diagnosis not present

## 2022-03-10 DIAGNOSIS — E7800 Pure hypercholesterolemia, unspecified: Secondary | ICD-10-CM | POA: Diagnosis not present

## 2022-03-10 DIAGNOSIS — Z1231 Encounter for screening mammogram for malignant neoplasm of breast: Secondary | ICD-10-CM

## 2022-03-13 ENCOUNTER — Ambulatory Visit (LOCAL_COMMUNITY_HEALTH_CENTER): Payer: Self-pay

## 2022-03-13 ENCOUNTER — Telehealth: Payer: Self-pay

## 2022-03-13 VITALS — Ht 62.0 in | Wt 130.0 lb

## 2022-03-13 DIAGNOSIS — R7612 Nonspecific reaction to cell mediated immunity measurement of gamma interferon antigen response without active tuberculosis: Secondary | ICD-10-CM

## 2022-03-13 NOTE — Telephone Encounter (Unsigned)
Call to patient to discuss test results, chest xray and treatment for LTBI. Patient is a pleasant 76 yr old female born in [place] and immigrated to the [place] in 1970. Pt had a           +QFT   10/20/21          Chest xray  10/20/21 (no active disease)             EPI    03/13/22  Patient 's PCP, Dr Burnadette Pop, referred patient  for LTBI treatment. Nurse discussed the differences between LTBI and Active TB. Explained LTBI treatment consists of a 4 month commitment taking 2 capsules a day with monthly visits to the health department at no cost to patient.   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 Patient also reports her husbands "coughs" due to work and mowing and he had lung surgery around 79-46 yrs of age.   No questions at this time. Reminded patient of appointment date (03/21/22) and time 9:40AM and to arrive 15 minutes to check in.  Augustin Schooling, RN

## 2022-03-13 NOTE — Progress Notes (Signed)
See telephone call 03/13/22

## 2022-03-14 ENCOUNTER — Other Ambulatory Visit: Payer: Self-pay

## 2022-03-14 ENCOUNTER — Other Ambulatory Visit: Payer: Self-pay | Admitting: Surgery

## 2022-03-14 NOTE — Progress Notes (Signed)
+  QFT: 10/20/2021 CXR: negative 10/20/2021 EPI: 10/26/2021 and 03/13/2022  HIV: neg per report (see EPI) RPR neg: 03/21/2022  CBC wnl 03/21/2022 LFTs wnl 03/21/2022  Tuberculosis treatment orders  All patients are to be monitored per [place] and county TB policies.   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 .   Baseline labs from 03/21/2022 were normal.   No need for additional labs unless concerning symptoms arise, new potentially hepatotoxic medications, or significant alcohol consumption is reported that would require monitoring.  03/23/2022, MD

## 2022-03-14 NOTE — Progress Notes (Signed)
Erroneous encounter Augustin Schooling, RN

## 2022-03-15 NOTE — Progress Notes (Signed)
Encounter reviewed. Kitara Hebb, RN  

## 2022-03-21 ENCOUNTER — Ambulatory Visit (LOCAL_COMMUNITY_HEALTH_CENTER): Payer: Medicare HMO

## 2022-03-21 VITALS — Ht 62.4 in | Wt 133.5 lb

## 2022-03-21 DIAGNOSIS — R7612 Nonspecific reaction to cell mediated immunity measurement of gamma interferon antigen response without active tuberculosis: Secondary | ICD-10-CM

## 2022-03-21 MED ORDER — RIFAMPIN 300 MG PO CAPS: 600.0000 mg | ORAL_CAPSULE | Freq: Every day | ORAL | 0 refills | Status: AC

## 2022-03-21 NOTE — Progress Notes (Signed)
Patient referred by Dr Cordelia Poche. +QFT and chest x-ray on 10/20/21. Patient here to start treatment for LTBI.  Patient presents with no signs or symptoms of TB. Reviewed the differences of latent vs active TB and form given to patient. Consent to start treatment signed. Offered HIV testing and patient signed consent, but then decided while having lab draw to decline test. Nurse followed up with Lab Tech Lowanda Foster) informing her of decision to decline. Order cancelled.   Discussed Rifampin information sheet, side effects and TB coordinator card given to call with any questions/concerns. Reminded patient to take two capsules once a day at the same time each day and if a dose is missed to not double up on medicine.  Rifampin 300 mg #60 dispensed per Dr Abbie Sons order. Scheduled next appointment for 04/21/22 at 8:30 AM.  Augustin Schooling, RN

## 2022-03-21 NOTE — Addendum Note (Signed)
Addended by: Fredderick Phenix on: 03/21/2022 11:58 AM   Modules accepted: Orders, Level of Service

## 2022-03-22 LAB — CBC WITH DIFFERENTIAL/PLATELET
Basophils Absolute: 0 10*3/uL (ref 0.0–0.2)
Basos: 1 %
EOS (ABSOLUTE): 0.2 10*3/uL (ref 0.0–0.4)
Eos: 2 %
Hematocrit: 41.2 % (ref 34.0–46.6)
Hemoglobin: 13.5 g/dL (ref 11.1–15.9)
Immature Grans (Abs): 0 10*3/uL (ref 0.0–0.1)
Immature Granulocytes: 0 %
Lymphocytes Absolute: 2.2 10*3/uL (ref 0.7–3.1)
Lymphs: 36 %
MCH: 29.5 pg (ref 26.6–33.0)
MCHC: 32.8 g/dL (ref 31.5–35.7)
MCV: 90 fL (ref 79–97)
Monocytes Absolute: 0.5 10*3/uL (ref 0.1–0.9)
Monocytes: 8 %
Neutrophils Absolute: 3.3 10*3/uL (ref 1.4–7.0)
Neutrophils: 53 %
Platelets: 214 10*3/uL (ref 150–450)
RBC: 4.58 x10E6/uL (ref 3.77–5.28)
RDW: 12.5 % (ref 11.7–15.4)
WBC: 6.2 10*3/uL (ref 3.4–10.8)

## 2022-03-22 LAB — HEPATIC FUNCTION PANEL
ALT: 32 IU/L (ref 0–32)
AST: 27 IU/L (ref 0–40)
Albumin: 4.5 g/dL (ref 3.8–4.8)
Alkaline Phosphatase: 78 IU/L (ref 44–121)
Bilirubin Total: 0.4 mg/dL (ref 0.0–1.2)
Bilirubin, Direct: 0.13 mg/dL (ref 0.00–0.40)
Total Protein: 7 g/dL (ref 6.0–8.5)

## 2022-03-30 ENCOUNTER — Telehealth: Payer: Self-pay | Admitting: Surgery

## 2022-03-30 DIAGNOSIS — R7612 Nonspecific reaction to cell mediated immunity measurement of gamma interferon antigen response without active tuberculosis: Secondary | ICD-10-CM

## 2022-03-30 NOTE — Telephone Encounter (Signed)
Texted pt that CBC and LFTs were normal from her start appt to tx LTBI, Rifampin #1. Will see her 04/21/2022 at 8:30am for next appointment.  Jennye Moccasin, MD

## 2022-04-03 NOTE — Addendum Note (Signed)
Addended by: Heywood Bene on: 04/03/2022 11:12 AM   Modules accepted: Orders

## 2022-04-18 ENCOUNTER — Ambulatory Visit
Admission: RE | Admit: 2022-04-18 | Discharge: 2022-04-18 | Disposition: A | Discharge status: Home / Self Care | Payer: Medicare HMO | Source: Ambulatory Visit | Attending: Family Medicine | Admitting: Family Medicine

## 2022-04-18 DIAGNOSIS — Z1231 Encounter for screening mammogram for malignant neoplasm of breast: Secondary | ICD-10-CM | POA: Insufficient documentation

## 2022-04-21 ENCOUNTER — Ambulatory Visit (LOCAL_COMMUNITY_HEALTH_CENTER): Payer: Medicare HMO

## 2022-04-21 VITALS — Wt 133.0 lb

## 2022-04-21 DIAGNOSIS — R7612 Nonspecific reaction to cell mediated immunity measurement of gamma interferon antigen response without active tuberculosis: Secondary | ICD-10-CM

## 2022-04-21 MED ORDER — RIFAMPIN 300 MG PO CAPS: 600.0000 mg | ORAL_CAPSULE | Freq: Every day | ORAL | 0 refills | Status: AC

## 2022-04-21 NOTE — Progress Notes (Signed)
In nurse clinic for LTBI med management and Rifampin #2.  Brought empty bottle and said took last 2 pills 2 days ago (Wednesday 9/27).  Denies missing pills otherwise.  Denies any complaints or change in medical hx.     Provided pt with Tb coordinator and Dr. Louretta Parma number if needed.   Dspensed Rifampin 300mg  (#60) 2 po daily as ordered by Dr. Sherrilee Gilles.  I provided counseling today regarding the medication. We discussed the medication, the side effects and when to call clinic. Patient given the opportunity to ask questions. Questions answered.   Walked pt to lab for monthly LFT.   Next appt 05/18/22 for Rifampin #3.  Tonny Branch, RN

## 2022-04-22 LAB — HEPATIC FUNCTION PANEL
ALT: 30 IU/L (ref 0–32)
AST: 30 IU/L (ref 0–40)
Albumin: 4.4 g/dL (ref 3.8–4.8)
Alkaline Phosphatase: 83 IU/L (ref 44–121)
Bilirubin Total: 0.2 mg/dL (ref 0.0–1.2)
Bilirubin, Direct: <0.1 mg/dL (ref 0.00–0.40)
Total Protein: 6.5 g/dL (ref 6.0–8.5)

## 2022-04-22 LAB — SPECIMEN STATUS REPORT

## 2022-05-18 ENCOUNTER — Ambulatory Visit (LOCAL_COMMUNITY_HEALTH_CENTER): Payer: Medicare HMO

## 2022-05-18 VITALS — Wt 132.5 lb

## 2022-05-18 DIAGNOSIS — R7612 Nonspecific reaction to cell mediated immunity measurement of gamma interferon antigen response without active tuberculosis: Secondary | ICD-10-CM

## 2022-05-18 MED ORDER — RIFAMPIN 300 MG PO CAPS: 600.0000 mg | ORAL_CAPSULE | Freq: Every day | ORAL | 0 refills | Status: AC

## 2022-05-18 NOTE — Progress Notes (Signed)
Patient here for Rifampin #3 visit for LTBI treatment. Patient has 3 doses (6 pills) remaining in bottle #2. Instructed to finish pills in bottle 2 before starting new bottle.  Patient complaining of left bottom foot and bilateral calf pain Patient reports she has received injections in the past for the foot pain and it is not new. She does a lot of walking and standing on her feet. Patient to continue with warm Epson salt soaks for feet and massage her foot. Nurse advised patient to f/u with PCP about foot and calf pain and to look into arch supporting shoes. Patient will also stop Rifampin for 3 days to see if foot and calf pain resolve. Patient's husband thinks the pain is due to Lipitor that patient is taking. Patient denies all other symptoms.No labs drawn at this visit. Discussed chronic foot/calf pain with Dr Lolita Lenz. No new orders.  Dispensed Rifampin 300 mg #60 per Dr Romona Curls standing order. I provided information about the medication.We discussed the medication and side effects and when to contact the TB coordinator. Patient given opportunity to ask questions. All questions answered.   Next appointment scheduled for November 27th at 8:20 AM.  Servando Salina, RN

## 2022-05-19 ENCOUNTER — Telehealth: Payer: Self-pay

## 2022-05-19 ENCOUNTER — Other Ambulatory Visit (LOCAL_COMMUNITY_HEALTH_CENTER): Payer: Self-pay

## 2022-05-19 DIAGNOSIS — R7612 Nonspecific reaction to cell mediated immunity measurement of gamma interferon antigen response without active tuberculosis: Secondary | ICD-10-CM

## 2022-05-19 NOTE — Telephone Encounter (Signed)
Call to patient to follow-up from office visit yesterday. No answer. Left message on VM with call back numbers.  Servando Salina, RN   05/19/22 Patient returned call. Reports that her daughter gave her a pair of compression stockings which is helping with her foot and legs. Nurse requested that the patient come to the HD this afternoon to have LFTs drawn. Patient states she will come around 3:00 PM.

## 2022-05-19 NOTE — Progress Notes (Unsigned)
Patient arrived for LFTs. RN explained to hold off taking Rifampin today until Monday, 05/22/22 and the RN will call her on Monday with her lab results.  Patient understood.  Servando Salina, RN

## 2022-05-20 LAB — HEPATIC FUNCTION PANEL
ALT: 31 IU/L (ref 0–32)
AST: 29 IU/L (ref 0–40)
Albumin: 4.3 g/dL (ref 3.8–4.8)
Alkaline Phosphatase: 119 IU/L (ref 44–121)
Bilirubin Total: <0.2 mg/dL (ref 0.0–1.2)
Bilirubin, Direct: <0.1 mg/dL (ref 0.00–0.40)
Total Protein: 6.7 g/dL (ref 6.0–8.5)

## 2022-05-22 ENCOUNTER — Telehealth: Payer: Self-pay

## 2022-05-22 NOTE — Telephone Encounter (Signed)
Call to patient to inform her that LFTs were wml. Patient can restart Rifampin today. Servando Salina, RN

## 2022-06-16 DIAGNOSIS — S99922A Unspecified injury of left foot, initial encounter: Secondary | ICD-10-CM | POA: Diagnosis not present

## 2022-06-16 DIAGNOSIS — S99912A Unspecified injury of left ankle, initial encounter: Secondary | ICD-10-CM | POA: Diagnosis not present

## 2022-06-19 ENCOUNTER — Ambulatory Visit (LOCAL_COMMUNITY_HEALTH_CENTER): Payer: Medicare HMO

## 2022-06-19 VITALS — Wt 134.0 lb

## 2022-06-19 DIAGNOSIS — R7612 Nonspecific reaction to cell mediated immunity measurement of gamma interferon antigen response without active tuberculosis: Secondary | ICD-10-CM

## 2022-06-19 MED ORDER — RIFAMPIN 300 MG PO CAPS: 600.0000 mg | ORAL_CAPSULE | Freq: Every day | ORAL | 0 refills | Status: AC

## 2022-06-19 NOTE — Progress Notes (Signed)
In nurse clinic for LTBI / TB Med completion / Rifampin #4  Reports taking Rifampin as prescribed and has missed one day of med. Pt states she has approx 10 pills remaining in bottle. Did not bring Rifampin bottle with her today. Advised to completed current Rifampin bottle before starting bottle #4.   Pt explains she fell and sprained left ankle and was seen by [facility] 06/16/2022.  Taking prednisone for 7 days and wearing ankle brace. Walking without assistance.  No other complaints today.   TB completion card given and explained.  Patient TB completion letter given  and explained.  TB contact card given and advised to call if questions, side effects, concerns.  Maudie Flakes RN plans to fax PCP the completion letter.   The patient was dispensed Rifampin 300 mg #60 (Rifampin #4) today per Dr Levonne Hubert order.  Instructions reviewed. I provided counseling today regarding the medication. We discussed the medication, the side effects and when to call clinic. Patient given the opportunity to ask questions. Questions answered.    RN walked pt to lab for LFT's. Jerel Shepherd, RN

## 2022-06-20 ENCOUNTER — Telehealth: Payer: Self-pay

## 2022-06-20 LAB — HEPATIC FUNCTION PANEL
ALT: 30 IU/L (ref 0–32)
AST: 25 IU/L (ref 0–40)
Albumin: 4.4 g/dL (ref 3.8–4.8)
Alkaline Phosphatase: 118 IU/L (ref 44–121)
Bilirubin Total: <0.2 mg/dL (ref 0.0–1.2)
Bilirubin, Direct: <0.1 mg/dL (ref 0.00–0.40)
Total Protein: 6.8 g/dL (ref 6.0–8.5)

## 2022-06-20 NOTE — Telephone Encounter (Signed)
Call to patient to inform her that her LFTs drawn on 06/19/22 were normal.  Augustin Schooling, RN

## 2022-09-05 DIAGNOSIS — R7303 Prediabetes: Secondary | ICD-10-CM | POA: Diagnosis not present

## 2022-09-05 DIAGNOSIS — E7800 Pure hypercholesterolemia, unspecified: Secondary | ICD-10-CM | POA: Diagnosis not present

## 2022-09-07 DIAGNOSIS — M3501 Sicca syndrome with keratoconjunctivitis: Secondary | ICD-10-CM | POA: Diagnosis not present

## 2022-09-07 DIAGNOSIS — B880 Other acariasis: Secondary | ICD-10-CM | POA: Diagnosis not present

## 2022-09-12 DIAGNOSIS — E785 Hyperlipidemia, unspecified: Secondary | ICD-10-CM | POA: Diagnosis not present

## 2022-09-12 DIAGNOSIS — Z0000 Encounter for general adult medical examination without abnormal findings: Secondary | ICD-10-CM | POA: Diagnosis not present

## 2022-09-12 DIAGNOSIS — R7303 Prediabetes: Secondary | ICD-10-CM | POA: Diagnosis not present

## 2022-09-12 DIAGNOSIS — Z1331 Encounter for screening for depression: Secondary | ICD-10-CM | POA: Diagnosis not present

## 2022-09-24 DIAGNOSIS — J0141 Acute recurrent pansinusitis: Secondary | ICD-10-CM | POA: Diagnosis not present

## 2023-01-17 DIAGNOSIS — J019 Acute sinusitis, unspecified: Secondary | ICD-10-CM | POA: Diagnosis not present

## 2023-01-17 DIAGNOSIS — H1033 Unspecified acute conjunctivitis, bilateral: Secondary | ICD-10-CM | POA: Diagnosis not present

## 2023-01-17 DIAGNOSIS — B9689 Other specified bacterial agents as the cause of diseases classified elsewhere: Secondary | ICD-10-CM | POA: Diagnosis not present

## 2023-02-19 DIAGNOSIS — S30861A Insect bite (nonvenomous) of abdominal wall, initial encounter: Secondary | ICD-10-CM | POA: Diagnosis not present

## 2023-02-19 DIAGNOSIS — L089 Local infection of the skin and subcutaneous tissue, unspecified: Secondary | ICD-10-CM | POA: Diagnosis not present

## 2023-03-06 DIAGNOSIS — E7800 Pure hypercholesterolemia, unspecified: Secondary | ICD-10-CM | POA: Diagnosis not present

## 2023-03-06 DIAGNOSIS — R7303 Prediabetes: Secondary | ICD-10-CM | POA: Diagnosis not present

## 2023-03-13 DIAGNOSIS — R7303 Prediabetes: Secondary | ICD-10-CM | POA: Diagnosis not present

## 2023-03-13 DIAGNOSIS — M810 Age-related osteoporosis without current pathological fracture: Secondary | ICD-10-CM | POA: Diagnosis not present

## 2023-03-13 DIAGNOSIS — E7800 Pure hypercholesterolemia, unspecified: Secondary | ICD-10-CM | POA: Diagnosis not present

## 2023-05-20 DIAGNOSIS — J329 Chronic sinusitis, unspecified: Secondary | ICD-10-CM | POA: Diagnosis not present

## 2023-05-20 DIAGNOSIS — B9689 Other specified bacterial agents as the cause of diseases classified elsewhere: Secondary | ICD-10-CM | POA: Diagnosis not present

## 2023-05-20 DIAGNOSIS — H109 Unspecified conjunctivitis: Secondary | ICD-10-CM | POA: Diagnosis not present

## 2023-06-15 DIAGNOSIS — W57XXXA Bitten or stung by nonvenomous insect and other nonvenomous arthropods, initial encounter: Secondary | ICD-10-CM | POA: Diagnosis not present

## 2023-06-15 DIAGNOSIS — L03211 Cellulitis of face: Secondary | ICD-10-CM | POA: Diagnosis not present

## 2023-09-10 DIAGNOSIS — Z961 Presence of intraocular lens: Secondary | ICD-10-CM | POA: Diagnosis not present

## 2023-09-10 DIAGNOSIS — H43813 Vitreous degeneration, bilateral: Secondary | ICD-10-CM | POA: Diagnosis not present

## 2023-09-10 DIAGNOSIS — M3501 Sicca syndrome with keratoconjunctivitis: Secondary | ICD-10-CM | POA: Diagnosis not present

## 2023-10-05 DIAGNOSIS — E7800 Pure hypercholesterolemia, unspecified: Secondary | ICD-10-CM | POA: Diagnosis not present

## 2023-10-05 DIAGNOSIS — R7303 Prediabetes: Secondary | ICD-10-CM | POA: Diagnosis not present

## 2023-10-12 DIAGNOSIS — Z0000 Encounter for general adult medical examination without abnormal findings: Secondary | ICD-10-CM | POA: Diagnosis not present

## 2023-10-12 DIAGNOSIS — E785 Hyperlipidemia, unspecified: Secondary | ICD-10-CM | POA: Diagnosis not present

## 2023-10-12 DIAGNOSIS — R7303 Prediabetes: Secondary | ICD-10-CM | POA: Diagnosis not present

## 2023-10-12 DIAGNOSIS — Z1331 Encounter for screening for depression: Secondary | ICD-10-CM | POA: Diagnosis not present

## 2023-10-18 ENCOUNTER — Other Ambulatory Visit: Payer: Self-pay | Admitting: Family Medicine

## 2023-10-18 DIAGNOSIS — Z1231 Encounter for screening mammogram for malignant neoplasm of breast: Secondary | ICD-10-CM

## 2023-11-19 ENCOUNTER — Ambulatory Visit
Admission: RE | Admit: 2023-11-19 | Discharge: 2023-11-19 | Disposition: A | Discharge status: Home / Self Care | Source: Ambulatory Visit | Attending: Family Medicine | Admitting: Family Medicine

## 2023-11-19 DIAGNOSIS — Z1231 Encounter for screening mammogram for malignant neoplasm of breast: Secondary | ICD-10-CM | POA: Diagnosis not present

## 2024-02-06 ENCOUNTER — Encounter: Payer: Self-pay | Admitting: Emergency Medicine

## 2024-02-06 ENCOUNTER — Emergency Department
Admission: EM | Admit: 2024-02-06 | Discharge: 2024-02-06 | Disposition: A | Discharge status: Home / Self Care | Source: Ambulatory Visit | Attending: Emergency Medicine | Admitting: Emergency Medicine

## 2024-02-06 ENCOUNTER — Other Ambulatory Visit: Payer: Self-pay

## 2024-02-06 DIAGNOSIS — T63441A Toxic effect of venom of bees, accidental (unintentional), initial encounter: Secondary | ICD-10-CM | POA: Diagnosis present

## 2024-02-06 DIAGNOSIS — T7840XA Allergy, unspecified, initial encounter: Secondary | ICD-10-CM | POA: Diagnosis not present

## 2024-02-06 MED ORDER — SODIUM CHLORIDE 0.9 % IV BOLUS
1000.0000 mL | Freq: Once | INTRAVENOUS | Status: AC
  Administered 2024-02-06: 1000 mL via INTRAVENOUS

## 2024-02-06 MED ORDER — METHYLPREDNISOLONE 4 MG PO TBPK: ORAL_TABLET | ORAL | 0 refills | Status: AC

## 2024-02-06 MED ORDER — DEXAMETHASONE SODIUM PHOSPHATE 10 MG/ML IJ SOLN
10.0000 mg | Freq: Once | INTRAMUSCULAR | Status: AC
Start: 2024-02-06 — End: 2024-02-06
  Administered 2024-02-06: 10 mg via INTRAVENOUS
  Filled 2024-02-06: qty 1

## 2024-02-06 MED ORDER — DOXYCYCLINE HYCLATE 100 MG PO TABS: 100.0000 mg | ORAL_TABLET | Freq: Two times a day (BID) | ORAL | 0 refills | Status: AC

## 2024-02-06 MED ORDER — FAMOTIDINE IN NACL 20-0.9 MG/50ML-% IV SOLN
20.0000 mg | Freq: Once | INTRAVENOUS | Status: AC
  Administered 2024-02-06: 20 mg via INTRAVENOUS
  Filled 2024-02-06: qty 50

## 2024-02-06 NOTE — Discharge Instructions (Signed)
 Follow-up with your regular doctor as needed.  Return if worsening.  Continue to take Benadryl.  Apply ice to the back of your arm.  I did give you a prescription for an antibiotic to help with any infection that may be occurring around the insect sting

## 2024-02-06 NOTE — ED Provider Notes (Signed)
 [facility] Provider Note    Event Date/Time   First MD Initiated Contact with Patient 02/06/24 1014     (approximate)   History   Allergic Reaction   HPI  Valerie Berry is a 78 y.o. female history of high cholesterol, cataracts presents emergency department with allergic reaction to a bee sting.  Bee sting actually happened 2 days ago.  Had some numbness and tingling along the arm related to the bee sting.  Now she has some sinus pressure and feels congestion.  No headache.  No chest pain or shortness of breath.  No swelling in her mouth.  Took Benadryl after the bee sting and felt a little better.  Went to [facility] today and they brought her here to the emergency department.  She states I do not know why they brought me here I am not having a headache in my face just has pressure from my sinuses for I have been stopped up .  She denies any weakness, slurred speech etc.      Physical Exam   Triage Vital Signs: ED Triage Vitals  Encounter Vitals Group     BP 02/06/24 0936 (!) 156/82     Girls Systolic BP Percentile --      Girls Diastolic BP Percentile --      Boys Systolic BP Percentile --      Boys Diastolic BP Percentile --      Pulse Rate 02/06/24 0936 66     Resp 02/06/24 0936 17     Temp 02/06/24 0936 97.8 F (36.6 C)     Temp Source 02/06/24 0936 Oral     SpO2 02/06/24 0936 100 %     Weight 02/06/24 0937 132 lb (59.9 kg)     Height 02/06/24 0937 5' 2 (1.575 m)     Head Circumference --      Peak Flow --      Pain Score 02/06/24 0936 9     Pain Loc --      Pain Education --      Exclude from Growth Chart --     Most recent vital signs: Vitals:   02/06/24 1044 02/06/24 1247  BP: (!) 145/73 (!) 140/63  Pulse:  60  Resp:  16  Temp:  97.8 F (36.6 C)  SpO2:  100%     General: Awake, no distress.   CV:  Good peripheral perfusion. regular rate and  rhythm Resp:  Normal effort. Lungs cta Abd:  No distention.    Other:  Cranial nerves II through XII grossly intact, grips equal bilaterally, no weakness noted   ED Results / Procedures / Treatments   Labs (all labs ordered are listed, but only abnormal results are displayed) Labs Reviewed - No data to display   EKG     RADIOLOGY     PROCEDURES:   Procedures  Critical Care: None Chief Complaint  Patient presents with   Allergic Reaction      MEDICATIONS ORDERED IN ED: Medications  sodium chloride  0.9 % bolus 1,000 mL (0 mLs Intravenous Stopped 02/06/24 1246)  dexamethasone  (DECADRON ) injection 10 mg (10 mg Intravenous Given 02/06/24 1054)  famotidine  (PEPCID ) IVPB 20 mg premix (0 mg Intravenous Stopped 02/06/24 1246)     IMPRESSION / MDM / ASSESSMENT AND PLAN / ED COURSE  I reviewed the triage vital signs and the nursing notes.  Differential diagnosis includes, but is not limited to, bee sting, cellulitis, allergic reaction  Patient's presentation is most consistent with Patient's presentation is most consistent with acute illness / injury with system symptoms.     Medications given: Decadron  10 mg IV, normal saline 1 L IV, Pepcid  20 mg IV.  Patient is driving so will not give her Benadryl.  The numbness and tingling seems to be directly related to the bee sting.  Do not feel the patient's had a CVA, SAH etc.  Sinus congestion noted.  Patient did have relief with Benadryl that she took earlier today.  Will go ahead and give her a allergy cocktail.  Will most likely treat the bee sting for a cellulitic area surrounding it.  Possible infection versus venom reaction.  Patient is feeling better after medications.  Will start her on antibiotic for the cellulitic area surrounding the bee sting.  Follow-up with her regular doctor.  Patient does not want to take prednisone.  States she is not actually allergic to prednisone she just does not like to take it.  She states when she is like this she knows  she needs it.  Patient was discharged stable condition.  Strict instructions to return for worsening     FINAL CLINICAL IMPRESSION(S) / ED DIAGNOSES   Final diagnoses:  Allergic reaction, initial encounter     Rx / DC Orders   ED Discharge Orders          Ordered    doxycycline  (VIBRA -TABS) 100 MG tablet  2 times daily        02/06/24 1232    methylPREDNISolone  (MEDROL  DOSEPAK) 4 MG TBPK tablet        02/06/24 1237             Note:  This document was prepared using Dragon voice recognition software and may include unintentional dictation errors.    Gasper Devere ORN, PA-C 02/06/24 1523    Waymond Lorelle Cummins, MD 02/06/24 [phone]

## 2024-02-06 NOTE — ED Triage Notes (Signed)
 Patient to ED via POV from [facility] for insect bite to left arm x2 days. PT reports she took benadryl yesterday that helped some. Today, woke up with facial itching/tingling. States she feels congested in her sinus'. Denies SOB or CP. Speaking in full sentences without difficulty. NAD noted. States pressure in head.

## 2024-02-11 DIAGNOSIS — T50905A Adverse effect of unspecified drugs, medicaments and biological substances, initial encounter: Secondary | ICD-10-CM | POA: Diagnosis not present

## 2024-02-11 DIAGNOSIS — Z789 Other specified health status: Secondary | ICD-10-CM | POA: Diagnosis not present

## 2024-02-11 DIAGNOSIS — L2989 Other pruritus: Secondary | ICD-10-CM | POA: Diagnosis not present

## 2024-02-11 DIAGNOSIS — Z91030 Bee allergy status: Secondary | ICD-10-CM | POA: Diagnosis not present

## 2024-02-11 DIAGNOSIS — L299 Pruritus, unspecified: Secondary | ICD-10-CM | POA: Diagnosis not present

## 2024-04-09 DIAGNOSIS — E7800 Pure hypercholesterolemia, unspecified: Secondary | ICD-10-CM | POA: Diagnosis not present

## 2024-04-09 DIAGNOSIS — R7303 Prediabetes: Secondary | ICD-10-CM | POA: Diagnosis not present

## 2024-04-16 DIAGNOSIS — R7303 Prediabetes: Secondary | ICD-10-CM | POA: Diagnosis not present

## 2024-04-16 DIAGNOSIS — M810 Age-related osteoporosis without current pathological fracture: Secondary | ICD-10-CM | POA: Diagnosis not present

## 2024-04-16 DIAGNOSIS — E7800 Pure hypercholesterolemia, unspecified: Secondary | ICD-10-CM | POA: Diagnosis not present
